# Patient Record
Sex: Female | Born: 1960 | Race: White | Hispanic: No | Marital: Married | Smoking: Never smoker
Health system: Southern US, Community
[De-identification: ages and names within clinical notes are randomized; demographics above are authoritative.]

## PROBLEM LIST (undated history)

## (undated) DIAGNOSIS — Z86718 Personal history of other venous thrombosis and embolism: Secondary | ICD-10-CM

## (undated) DIAGNOSIS — F32A Depression, unspecified: Secondary | ICD-10-CM

## (undated) DIAGNOSIS — I10 Essential (primary) hypertension: Secondary | ICD-10-CM

## (undated) DIAGNOSIS — E669 Obesity, unspecified: Secondary | ICD-10-CM

## (undated) DIAGNOSIS — F329 Major depressive disorder, single episode, unspecified: Secondary | ICD-10-CM

## (undated) HISTORY — DX: Major depressive disorder, single episode, unspecified: F32.9

## (undated) HISTORY — DX: Depression, unspecified: F32.A

## (undated) HISTORY — DX: Personal history of other venous thrombosis and embolism: Z86.718

## (undated) HISTORY — DX: Essential (primary) hypertension: I10

## (undated) HISTORY — DX: Obesity, unspecified: E66.9

---
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
## 1987-06-10 HISTORY — PX: WISDOM TOOTH EXTRACTION: SHX21

## 2004-04-25 ENCOUNTER — Ambulatory Visit: Payer: Self-pay | Admitting: Internal Medicine

## 2004-05-27 ENCOUNTER — Ambulatory Visit: Payer: Self-pay | Admitting: Internal Medicine

## 2005-05-05 ENCOUNTER — Ambulatory Visit: Payer: Self-pay | Admitting: Internal Medicine

## 2006-03-24 ENCOUNTER — Ambulatory Visit: Payer: Self-pay | Admitting: Internal Medicine

## 2007-06-10 HISTORY — PX: ABDOMINAL HYSTERECTOMY: SHX81

## 2007-06-10 HISTORY — PX: TOTAL ABDOMINAL HYSTERECTOMY: SHX209

## 2015-11-15 LAB — BASIC METABOLIC PANEL
BUN: 18 mg/dL (ref 4–21)
Creatinine: 1.5 mg/dL — AB (ref 0.5–1.1)
Glucose: 124 mg/dL

## 2015-11-15 LAB — CBC AND DIFFERENTIAL
HEMATOCRIT: 34 % — AB (ref 36–46)
HEMOGLOBIN: 11.2 g/dL — AB (ref 12.0–16.0)
WBC: 7.2 10*3/mL

## 2015-11-15 LAB — LIPID PANEL
CHOLESTEROL: 170 mg/dL (ref 0–200)
HDL: 55 mg/dL (ref 35–70)
LDL Cholesterol: 98 mg/dL
TRIGLYCERIDES: 84 mg/dL (ref 40–160)

## 2015-11-15 LAB — HEPATIC FUNCTION PANEL
ALT: 17 U/L (ref 7–35)
AST: 15 U/L (ref 13–35)

## 2015-11-16 LAB — HEPATIC FUNCTION PANEL
ALT: 17 U/L (ref 7–35)
AST: 15 U/L (ref 13–35)
Alkaline Phosphatase: 64 U/L (ref 25–125)
Bilirubin, Total: 0.7 mg/dL

## 2015-11-16 LAB — BASIC METABOLIC PANEL
BUN: 18 mg/dL (ref 4–21)
CREATININE: 1.5 mg/dL — AB (ref 0.5–1.1)
GLUCOSE: 124 mg/dL
POTASSIUM: 4.1 mmol/L (ref 3.4–5.3)
Sodium: 141 mmol/L (ref 137–147)

## 2015-12-18 ENCOUNTER — Ambulatory Visit (INDEPENDENT_AMBULATORY_CARE_PROVIDER_SITE_OTHER): Payer: PRIVATE HEALTH INSURANCE | Admitting: Physician Assistant

## 2015-12-18 ENCOUNTER — Encounter: Payer: Self-pay | Admitting: Physician Assistant

## 2015-12-18 VITALS — BP 134/64 | HR 80 | Ht 69.0 in | Wt 274.0 lb

## 2015-12-18 DIAGNOSIS — N183 Chronic kidney disease, stage 3 unspecified: Secondary | ICD-10-CM | POA: Insufficient documentation

## 2015-12-18 DIAGNOSIS — I10 Essential (primary) hypertension: Secondary | ICD-10-CM | POA: Diagnosis not present

## 2015-12-18 DIAGNOSIS — Z86718 Personal history of other venous thrombosis and embolism: Secondary | ICD-10-CM | POA: Diagnosis not present

## 2015-12-18 DIAGNOSIS — F411 Generalized anxiety disorder: Secondary | ICD-10-CM | POA: Insufficient documentation

## 2015-12-18 DIAGNOSIS — Z1211 Encounter for screening for malignant neoplasm of colon: Secondary | ICD-10-CM

## 2015-12-18 DIAGNOSIS — K2100 Gastro-esophageal reflux disease with esophagitis, without bleeding: Secondary | ICD-10-CM

## 2015-12-18 DIAGNOSIS — K222 Esophageal obstruction: Secondary | ICD-10-CM

## 2015-12-18 MED ORDER — LORCASERIN HCL 10 MG PO TABS: 1.0000 | ORAL_TABLET | Freq: Two times a day (BID) | ORAL | Status: DC

## 2015-12-18 MED ORDER — RANITIDINE HCL 150 MG PO TABS: 150.0000 mg | ORAL_TABLET | Freq: Two times a day (BID) | ORAL | Status: DC

## 2015-12-19 ENCOUNTER — Encounter: Payer: Self-pay | Admitting: *Deleted

## 2015-12-19 DIAGNOSIS — Z6839 Body mass index (BMI) 39.0-39.9, adult: Secondary | ICD-10-CM | POA: Insufficient documentation

## 2015-12-19 DIAGNOSIS — Z6838 Body mass index (BMI) 38.0-38.9, adult: Secondary | ICD-10-CM

## 2015-12-19 DIAGNOSIS — E66812 Obesity, class 2: Secondary | ICD-10-CM | POA: Insufficient documentation

## 2015-12-19 NOTE — Progress Notes (Signed)
   Subjective:    Patient ID: Renee Wade, female    DOB: Jan 23, 1961, 55 y.o.   MRN: [id number]  HPI [name] a 55 yo female who presents to the clinic to establish care.  .. Active Ambulatory Problems    Diagnosis Date Noted  . Esophageal stenosis 12/18/2015  . Gastroesophageal reflux disease with esophagitis 12/18/2015  . Essential hypertension, benign 12/18/2015  . History of DVT (deep vein thrombosis) 12/18/2015  . Anxiety state 12/18/2015  . CKD (chronic kidney disease) 12/18/2015   Resolved Ambulatory Problems    Diagnosis Date Noted  . No Resolved Ambulatory Problems   Past Medical History  Diagnosis Date  . Hypertension   . H/O blood clots    .Marland Kitchen Family History  Problem Relation Age of Onset  . Ovarian cancer      grandmother  . Heart attack Father   . Diabetes      grandmother    .Marland Kitchen Social History   Social History  . Marital Status: Married    Spouse Name: N/A  . Number of Children: N/A  . Years of Education: N/A   Occupational History  . Not on file.   Social History Main Topics  . Smoking status: Never Smoker   . Smokeless tobacco: Not on file  . Alcohol Use: No  . Drug Use: No  . Sexual Activity:    Partners: Male   Other Topics Concern  . Not on file   Social History Narrative  . No narrative on file    Does not need refills.   She does feel like GERD and esophageal stenosis at times is not controlled with protonix. Hx of dilation of esophagus. Most of symptoms are at night when she lays down.     Review of Systems  All other systems reviewed and are negative.      Objective:   Physical Exam  Constitutional: She is oriented to person, place, and time. She appears well-developed and well-nourished.  Obesity.   HENT:  Head: Normocephalic and atraumatic.  Cardiovascular: Normal rate, regular rhythm and normal heart sounds.   Pulmonary/Chest: Effort normal and breath sounds normal.  Neurological: She is alert and oriented to  person, place, and time.  Skin: Skin is dry.  Psychiatric: She has a normal mood and affect. Her behavior is normal.          Assessment & Plan:  Morbid obesity- discussed options. BMI 40.  Will start belviq bid. Discussed side effects. Discussed diet and exercise. 150 minutes of exercise a week and 1500 calorie diet. Follow up in 2 months.   GERD/esophageal dilation- continue protonix and zantac as needed at least before dinner.   HTN-controlled benicar.   CKD, stage 3- last Scr 1.48. Discussed stay away from NSAIDs. Tylenol only. Will continue to monitor.   Anxiety- as needed klonapin. Averaging once a day. Does not need refills.   Hx of DVT- daily ASA 325mg .

## 2016-01-08 ENCOUNTER — Encounter: Payer: Self-pay | Admitting: Student

## 2016-01-08 DIAGNOSIS — M67439 Ganglion, unspecified wrist: Secondary | ICD-10-CM | POA: Insufficient documentation

## 2016-01-24 ENCOUNTER — Other Ambulatory Visit: Payer: Self-pay | Admitting: *Deleted

## 2016-01-24 MED ORDER — DICLOFENAC SODIUM 1 % TD GEL: 2.0000 g | TRANSDERMAL | 3 refills | Status: DC | PRN

## 2016-01-24 NOTE — Progress Notes (Unsigned)
Rx sent to pharmacy   

## 2016-01-29 ENCOUNTER — Encounter: Payer: Self-pay | Admitting: Physician Assistant

## 2016-02-07 LAB — HM COLONOSCOPY

## 2016-02-13 ENCOUNTER — Encounter: Payer: Self-pay | Admitting: Physician Assistant

## 2016-02-13 DIAGNOSIS — D369 Benign neoplasm, unspecified site: Secondary | ICD-10-CM | POA: Insufficient documentation

## 2016-05-06 LAB — HM MAMMOGRAPHY

## 2016-05-14 ENCOUNTER — Ambulatory Visit (INDEPENDENT_AMBULATORY_CARE_PROVIDER_SITE_OTHER): Payer: PRIVATE HEALTH INSURANCE | Admitting: Physician Assistant

## 2016-05-14 ENCOUNTER — Encounter: Payer: Self-pay | Admitting: Physician Assistant

## 2016-05-14 VITALS — BP 113/72 | HR 70 | Ht 69.0 in | Wt 276.0 lb

## 2016-05-14 DIAGNOSIS — R059 Cough, unspecified: Secondary | ICD-10-CM

## 2016-05-14 DIAGNOSIS — F411 Generalized anxiety disorder: Secondary | ICD-10-CM | POA: Diagnosis not present

## 2016-05-14 DIAGNOSIS — I10 Essential (primary) hypertension: Secondary | ICD-10-CM | POA: Diagnosis not present

## 2016-05-14 DIAGNOSIS — K2100 Gastro-esophageal reflux disease with esophagitis, without bleeding: Secondary | ICD-10-CM

## 2016-05-14 DIAGNOSIS — R05 Cough: Secondary | ICD-10-CM | POA: Diagnosis not present

## 2016-05-14 LAB — COMPLETE METABOLIC PANEL WITHOUT GFR
ALT: 17 U/L (ref 6–29)
AST: 16 U/L (ref 10–35)
Albumin: 4.1 g/dL (ref 3.6–5.1)
Alkaline Phosphatase: 55 U/L (ref 33–130)
BUN: 17 mg/dL (ref 7–25)
CO2: 26 mmol/L (ref 20–31)
Calcium: 9.4 mg/dL (ref 8.6–10.4)
Chloride: 102 mmol/L (ref 98–110)
Creat: 1.53 mg/dL — ABNORMAL HIGH (ref 0.50–1.05)
GFR, Est African American: 44 mL/min — ABNORMAL LOW
GFR, Est Non African American: 38 mL/min — ABNORMAL LOW
Glucose, Bld: 128 mg/dL — ABNORMAL HIGH (ref 65–99)
Potassium: 4.1 mmol/L (ref 3.5–5.3)
Sodium: 138 mmol/L (ref 135–146)
Total Bilirubin: 0.8 mg/dL (ref 0.2–1.2)
Total Protein: 6.9 g/dL (ref 6.1–8.1)

## 2016-05-14 MED ORDER — DICLOFENAC SODIUM 1 % TD GEL: 2.0000 g | TRANSDERMAL | 3 refills | Status: DC | PRN

## 2016-05-14 MED ORDER — OLMESARTAN MEDOXOMIL-HCTZ 20-12.5 MG PO TABS: 1.0000 | ORAL_TABLET | Freq: Every day | ORAL | 5 refills | Status: DC

## 2016-05-14 MED ORDER — BENZONATATE 200 MG PO CAPS
200.0000 mg | ORAL_CAPSULE | Freq: Two times a day (BID) | ORAL | 0 refills | Status: DC | PRN
Start: 2016-05-14 — End: 2016-11-12

## 2016-05-14 MED ORDER — CLONAZEPAM 0.5 MG PO TABS: ORAL_TABLET | ORAL | 5 refills | Status: DC

## 2016-05-14 MED ORDER — RANITIDINE HCL 150 MG PO TABS: 150.0000 mg | ORAL_TABLET | Freq: Two times a day (BID) | ORAL | 11 refills | Status: DC

## 2016-05-14 MED ORDER — PANTOPRAZOLE SODIUM 40 MG PO TBEC: 40.0000 mg | DELAYED_RELEASE_TABLET | Freq: Every day | ORAL | 11 refills | Status: DC

## 2016-05-14 NOTE — Progress Notes (Addendum)
  Subjective:    Patient ID: Renee Wade, female    DOB: May 27, 1961, 55 y.o.   MRN: [id number]  HPI Patient is a 55 yo female coming to the office today for a six month follow-up for hypertension. Patient is requesting refills for klonopin, voltaren, benicar and protonix.   Patient is also complaining of a cough for five days. She reports she occasionally coughs up yellow phlegm. She also feels short of breath. Patient has taken mucinex with relief. Patient denies nasal congestion, fever, sore throat, fatigue and rhinorrhea. Patient reports that her husband and son has similar symptoms.   Review of Systems  Constitutional: Negative for activity change, fatigue and fever.  HENT: Negative for congestion, rhinorrhea and sore throat.   Respiratory: Positive for cough and shortness of breath.        Objective: Blood pressure 113/72, pulse 70, height 5\' 9"  (1.753 m), weight 276 lb (125.2 kg).   Physical Exam  Constitutional:  Patient is dry coughing during exam.   Cardiovascular: Normal rate and regular rhythm.  Exam reveals no gallop and no friction rub.   No murmur heard. Pulmonary/Chest: Effort normal and breath sounds normal. No respiratory distress. She has no wheezes. She has no rales.       Assessment & Plan:  Dorys was seen today for hypertension and cough.  Diagnoses and all orders for this visit:  1. Cough - Start benzonatate (TESSALON) 200 MG capsule; Take 1 capsule (200 mg total) by mouth 2 (two) times daily as needed for cough. -Told patient to follow-up if symptoms persist.  -If patient's symptoms continue, told patient that she could call ahead of time to get a solumedrol injection 125mg  in office.   2. hypertension, benign -Continue olmesartan-hydrochlorothiazide (BENICAR HCT) 20-12.5 MG tablet; Take 1 tablet by mouth daily for hypertension. Patient blood pressure is well controlled on this medication.  -Check COMPLETE METABOLIC PANEL WITH GFR to monitor  electrolytes, renal and liver function.  -Follow-up in six months   3. Gastroesophageal reflux disease with esophagitis -Continue pantoprazole (PROTONIX) 40 MG tablet; Take 1 tablet (40 mg total) by mouth daily. -Continue ranitidine (ZANTAC) 150 MG tablet; Take 1 tablet (150 mg total) by mouth 2 (two) times daily.  4. Anxiety state -Refilled clonazePAM (KLONOPIN) 0.5 MG tablet; Take one tablet (0.5 mg total) by mouth 2 (two) times a day as needed for Anxiety.  Other orders -Refilled diclofenac sodium (VOLTAREN) 1 % GEL; Apply 2 g topically as needed for joint and muscle pain.

## 2016-06-06 ENCOUNTER — Encounter: Payer: Self-pay | Admitting: Physician Assistant

## 2016-07-14 ENCOUNTER — Other Ambulatory Visit: Payer: Self-pay | Admitting: Physician Assistant

## 2016-07-14 DIAGNOSIS — I10 Essential (primary) hypertension: Secondary | ICD-10-CM

## 2016-11-12 ENCOUNTER — Encounter: Payer: Self-pay | Admitting: Physician Assistant

## 2016-11-12 ENCOUNTER — Ambulatory Visit (INDEPENDENT_AMBULATORY_CARE_PROVIDER_SITE_OTHER): Payer: PRIVATE HEALTH INSURANCE | Admitting: Physician Assistant

## 2016-11-12 VITALS — BP 111/68 | HR 69 | Ht 69.0 in | Wt 268.0 lb

## 2016-11-12 DIAGNOSIS — F411 Generalized anxiety disorder: Secondary | ICD-10-CM | POA: Diagnosis not present

## 2016-11-12 DIAGNOSIS — M7062 Trochanteric bursitis, left hip: Secondary | ICD-10-CM | POA: Diagnosis not present

## 2016-11-12 DIAGNOSIS — Z13220 Encounter for screening for lipoid disorders: Secondary | ICD-10-CM

## 2016-11-12 DIAGNOSIS — N183 Chronic kidney disease, stage 3 unspecified: Secondary | ICD-10-CM

## 2016-11-12 DIAGNOSIS — I10 Essential (primary) hypertension: Secondary | ICD-10-CM

## 2016-11-12 DIAGNOSIS — M7632 Iliotibial band syndrome, left leg: Secondary | ICD-10-CM

## 2016-11-12 DIAGNOSIS — Z6839 Body mass index (BMI) 39.0-39.9, adult: Secondary | ICD-10-CM

## 2016-11-12 LAB — LIPID PANEL W/REFLEX DIRECT LDL
CHOL/HDL RATIO: 3.3 ratio (ref <5.0)
CHOLESTEROL: 169 mg/dL (ref <200)
HDL: 52 mg/dL (ref >50)
LDL-CHOLESTEROL: 97 mg/dL
NON-HDL CHOLESTEROL (CALC): 117 mg/dL (ref <130)
Triglycerides: 107 mg/dL (ref <150)

## 2016-11-12 LAB — COMPLETE METABOLIC PANEL WITH GFR
ALT: 16 U/L (ref 6–29)
AST: 14 U/L (ref 10–35)
Albumin: 4.1 g/dL (ref 3.6–5.1)
Alkaline Phosphatase: 56 U/L (ref 33–130)
BUN: 20 mg/dL (ref 7–25)
CALCIUM: 9.5 mg/dL (ref 8.6–10.4)
CHLORIDE: 103 mmol/L (ref 98–110)
CO2: 25 mmol/L (ref 20–31)
Creat: 1.54 mg/dL — ABNORMAL HIGH (ref 0.50–1.05)
GFR, Est African American: 43 mL/min — ABNORMAL LOW (ref >=60)
GFR, Est Non African American: 38 mL/min — ABNORMAL LOW (ref >=60)
Glucose, Bld: 118 mg/dL — ABNORMAL HIGH (ref 65–99)
POTASSIUM: 4.1 mmol/L (ref 3.5–5.3)
Sodium: 139 mmol/L (ref 135–146)
Total Bilirubin: 1 mg/dL (ref 0.2–1.2)
Total Protein: 6.8 g/dL (ref 6.1–8.1)

## 2016-11-12 MED ORDER — LIRAGLUTIDE -WEIGHT MANAGEMENT 18 MG/3ML ~~LOC~~ SOPN: 0.6000 mg | PEN_INJECTOR | Freq: Every day | SUBCUTANEOUS | 1 refills | Status: DC

## 2016-11-12 MED ORDER — CLONAZEPAM 0.5 MG PO TABS: ORAL_TABLET | ORAL | 5 refills | Status: DC

## 2016-11-12 MED ORDER — OLMESARTAN MEDOXOMIL-HCTZ 20-12.5 MG PO TABS: 1.0000 | ORAL_TABLET | Freq: Every day | ORAL | 5 refills | Status: DC

## 2016-11-12 MED ORDER — DICLOFENAC SODIUM 1 % TD GEL: 2.0000 g | TRANSDERMAL | 3 refills | Status: DC | PRN

## 2016-11-12 NOTE — Patient Instructions (Signed)
Hip Bursitis Hip bursitis is swelling of a fluid-filled sac (bursa) in your hip. This swelling (inflammation) can be painful. This condition may come and go over time. Follow these instructions at home: Medicines  Take over-the-counter and prescription medicines only as told by your doctor.  Do not drive or use heavy machinery while taking prescription pain medicine, or as told by your doctor.  If you were prescribed an antibiotic medicine, take it as told by your doctor. Do not stop taking the antibiotic even if you start to feel better. Activity  Return to your normal activities as told by your doctor. Ask your doctor what activities are safe for you.  Rest and protect your hip until you feel better. General instructions  Wear wraps that put pressure on your hip (compression wraps) only as told by your doctor.  Raise (elevate) your hip above the level of your heart as much as you can. To do this, try putting a pillow under your hips while you lie down. Stop if this causes pain.  Do not use your hip to support your body weight until your doctor says that you can.  Use crutches as told by your doctor.  Gently rub and stretch your injured area as often as is comfortable.  Keep all follow-up visits as told by your doctor. This is important. How is this prevented?  Exercise regularly, as told by your doctor.  Warm up and stretch before being active.  Cool down and stretch after being active.  Avoid activities that bother your hip or cause pain.  Avoid sitting down for long periods at a time. Contact a doctor if:  You have a fever.  You get new symptoms.  You have trouble walking.  You have trouble doing everyday activities.  You have pain that gets worse.  You have pain that does not get better with medicine.  You get red skin on your hip area.  You get a feeling of warmth in your hip area. Get help right away if:  You cannot move your hip.  You have very bad  pain. This information is not intended to replace advice given to you by your health care provider. Make sure you discuss any questions you have with your health care provider. Document Released: 06/28/2010 Document Revised: 11/01/2015 Document Reviewed: 12/26/2014 Elsevier Interactive Patient Education  2018 [place] Ask your health care provider which exercises are safe for you. Do exercises exactly as told by your health care provider and adjust them as directed. It is normal to feel mild stretching, pulling, tightness, or discomfort as you do these exercises, but you should stop right away if you feel sudden pain or your pain gets worse.Do not begin these exercises until told by your health care provider. Stretching and range of motion exercises These exercises warm up your muscles and joints and improve the movement and flexibility of your leg. These exercises also help to relieve pain and stiffness. Exercise A: Quadriceps stretch, prone  1. Lie on your abdomen on a firm surface, such as a bed or padded floor. 2. Bend your left / right knee and hold your ankle. If you cannot reach your ankle or pant leg, loop a belt around your foot and grab the belt instead. 3. Gently pull your heel toward your buttocks. Your knee should not slide out to the side. You should feel a stretch in the front of your thigh and knee. 4. Hold this position for __________ seconds.  Repeat __________ times. Complete this exercise __________ times a day. Exercise B: Lunge ( adductor stretch) 1. Stand and spread your legs about 3 feet (about 1 m) apart. Put your left / right leg slightly back for balance. 2. Lean away from your left / right leg by bending your other knee and shifting your weight toward your bent knee. You may rest your hands on your thigh for balance. You should feel a stretch in your left / right inner thigh. 3. Hold for __________ seconds. Repeat __________ times.  Complete this exercise __________ times a day. Exercise C: Hamstring stretch, supine  1. Lie on your back. 2. Hold both ends of a belt or towel as you loop it over the ball of your left / right foot. The ball of your foot is on the walking surface, right under your toes. 3. Straighten your left / right knee and slowly pull on the belt to raise your leg. Stop when you feel a gentle stretch in the back of your left / right knee or thigh. ? Do not let your left / right knee bend. ? Keep your other leg flat on the floor. 4. Hold this position for __________ seconds. Repeat __________ times. Complete this exercise __________ times a day. Strengthening exercises These exercises build strength and endurance in your leg. Endurance is the ability to use your muscles for a long time, even after they get tired. Exercise D: Quadriceps wall slides  1. Lean your back against a smooth wall or door while you walk your feet out 18-24 inches (46-61 cm) from it. 2. Place your feet hip-width apart. 3. Slowly slide down the wall or door until your knees bend as far as told by your health care provider. Keep your knees over your heels, not your toes. Keep your knees in line with your hips. 4. Hold for __________ seconds. 5. Push through your heels to stand up to rest for __________ seconds after each repetition. Repeat __________ times. Complete this exercise __________ times a day. Exercise E: Straight leg raises ( hip abductors) 1. Lie on your side, with your left / right leg in the top position. Lie so your head, shoulder, knee, and hip line up with each other. You may bend your bottom knee to help you balance. 2. Lift your top leg 4-6 inches (10-15 cm) while keeping your toes pointed straight ahead. 3. Hold this position for __________ seconds. 4. Slowly lower your leg to the starting position. Allow your muscles to relax completely after each repetition. Repeat __________ times. Complete this exercise  __________ times a day. Exercise F: Straight leg raises ( hip extensors) 1. Lie on your abdomen on a firm surface. You can put a pillow under your hips if that is more comfortable. 2. Tense the muscles in your buttocks and lift your left / right leg about 4-6 inches (10-15 cm). Keep your knee straight as you lift your leg. 3. Hold this position for __________ seconds. 4. Slowly lower your leg to the starting position. 5. Let your leg relax completely after each repetition. Repeat __________ times. Complete this exercise __________ times a day. Exercise G: Bridge ( hip extensors) 1. Lie on your back on a firm surface with your knees bent and your feet flat on the floor. 2. Tighten your buttocks muscles and lift your bottom off the floor until your trunk is level with your thighs. ? Do not arch your back. ? You should feel the muscles working in your  buttocks and the back of your thighs. If you do not feel these muscles, slide your feet 1-2 inches (2.5-5 cm) farther away from your buttocks. 3. Hold this position for __________ seconds. 4. Slowly lower your hips to the starting position. 5. Let your buttocks muscles relax completely between repetitions. 6. If this exercise is too easy, try doing it with your arms crossed over your chest. Repeat __________ times. Complete this exercise __________ times a day. This information is not intended to replace advice given to you by your health care provider. Make sure you discuss any questions you have with your health care provider. Document Released: 05/26/2005 Document Revised: 01/31/2016 Document Reviewed: 05/08/2015 Elsevier Interactive Patient Education  [employer].

## 2016-11-12 NOTE — Progress Notes (Signed)
Subjective:    Patient ID: Renee Wade, female    DOB: [birth year], 56 y.o.   MRN: [id number]  HPI [name] a 56 yo female who presents to the clinic for 6 month medication refill.   HTN- doing great. No CP, palpitations, headaches, vision changes.   Anxiety- uses clonazepam at this time as needed. Pt reports to be doing well.   Ongoing left hip and radiation down lateral leg but does not pass the knee pain for last 3 months. Worse after long periods of walking or laying on her left side. Due to CKD no oral NSAIDs but does use voltaren gel.  She would like to lose weight. Tried and failed contrave and belviq. She lost 10lbs earlier this year but gained it all back.   .. Active Ambulatory Problems    Diagnosis Date Noted  . Esophageal stenosis 12/18/2015  . Gastroesophageal reflux disease with esophagitis 12/18/2015  . Essential hypertension, benign 12/18/2015  . History of DVT (deep vein thrombosis) 12/18/2015  . Anxiety state 12/18/2015  . CKD (chronic kidney disease) stage 3, GFR 30-59 ml/min 12/18/2015  . Class 2 severe obesity due to excess calories with serious comorbidity and body mass index (BMI) of 39.0 to 39.9 in adult ([place]) 12/19/2015  . Ganglion cyst of wrist, left 01/08/2016  . Adenomatous polyp 02/13/2016  . It band syndrome, left 11/12/2016  . Trochanteric bursitis of left hip 11/12/2016   Resolved Ambulatory Problems    Diagnosis Date Noted  . No Resolved Ambulatory Problems   Past Medical History:  Diagnosis Date  . H/O DVT   . Hypertension       Review of Systems  All other systems reviewed and are negative.      Objective:   Physical Exam        Assessment & Plan:  Marland [name] KitchenNanie was seen today for hypertension.  Diagnoses and all orders for this visit:  Essential hypertension, benign -     olmesartan-hydrochlorothiazide (BENICAR HCT) 20-12.5 MG tablet; Take 1 tablet by mouth daily.  Anxiety state -     clonazePAM (KLONOPIN) 0.5 MG tablet;  Take one tablet (0.5 mg total) by mouth 2 (two) times a day as needed for Anxiety.  Trochanteric bursitis of left hip -     diclofenac sodium (VOLTAREN) 1 % GEL; Apply 2 g topically as needed.  CKD (chronic kidney disease) stage 3, GFR 30-59 ml/min -     COMPLETE METABOLIC PANEL WITH GFR  Screening for lipid disorders -     Lipid Panel w/reflex Direct LDL  It band syndrome, left -     diclofenac sodium (VOLTAREN) 1 % GEL; Apply 2 g topically as needed.  Class 2 severe obesity due to excess calories with serious comorbidity and body mass index (BMI) of 39.0 to 39.9 in adult (HCC) -     Liraglutide -Weight Management (SAXENDA) 18 MG/3ML SOPN; Inject 0.6 mg into the skin daily. For one week then increase by .6mg  weekly until reaches 3mg  daily.  Please include ultra fine needles 75mm   Aspiration/Injection Procedure Note Renee Wade [id number] June 19, 1960  Procedure: Injection Indications: pain from greater trochanteric bursitis  Procedure Details Consent: Risks of procedure as well as the alternatives and risks of each were explained to the (patient/caregiver).  Consent for procedure obtained. Time Out: Verified patient identification, verified procedure, site/side was marked, verified correct patient position, special equipment/implants available, medications/allergies/relevent history reviewed, required imaging and test results available.  Performed  Local Anesthesia Used:Ethyl Chloride Spray  Cleaned with chlorohexiadine and injected depo medrol 40mg  and lidocaine 1 percent no epi 9cc into left bursa of the greater trochanter.   A sterile dressing was applied.  Patient did tolerate procedure well.  Renee Wade    No NSAIDs due to CKD. Continue to use voltaren gel. HO given with home PT to start. Follow up with sports medicine as needed. Tylenol for pain.   Discussed weight. Discussed 1500 calorie diet and 150 minutes of exercise a week.  Start saxenda. Coupon card  given. Discussed side effects. Discussed tapering up. Follow up in 2 months.

## 2016-11-13 ENCOUNTER — Encounter: Payer: Self-pay | Admitting: Physician Assistant

## 2016-11-13 ENCOUNTER — Ambulatory Visit (INDEPENDENT_AMBULATORY_CARE_PROVIDER_SITE_OTHER): Payer: PRIVATE HEALTH INSURANCE | Admitting: Physician Assistant

## 2016-11-13 VITALS — BP 123/73 | HR 81

## 2016-11-13 DIAGNOSIS — E119 Type 2 diabetes mellitus without complications: Secondary | ICD-10-CM | POA: Diagnosis not present

## 2016-11-13 DIAGNOSIS — E785 Hyperlipidemia, unspecified: Secondary | ICD-10-CM | POA: Diagnosis not present

## 2016-11-13 DIAGNOSIS — N183 Chronic kidney disease, stage 3 unspecified: Secondary | ICD-10-CM

## 2016-11-13 DIAGNOSIS — Z23 Encounter for immunization: Secondary | ICD-10-CM | POA: Diagnosis not present

## 2016-11-13 DIAGNOSIS — E1122 Type 2 diabetes mellitus with diabetic chronic kidney disease: Secondary | ICD-10-CM | POA: Insufficient documentation

## 2016-11-13 LAB — POCT GLYCOSYLATED HEMOGLOBIN (HGB A1C): Hemoglobin A1C: 6.5

## 2016-11-13 MED ORDER — METFORMIN HCL ER 500 MG PO TB24: 1000.0000 mg | ORAL_TABLET | Freq: Every day | ORAL | 1 refills | Status: DC

## 2016-11-13 MED ORDER — ATORVASTATIN CALCIUM 10 MG PO TABS: 10.0000 mg | ORAL_TABLET | Freq: Every day | ORAL | 3 refills | Status: DC

## 2016-11-13 NOTE — Progress Notes (Signed)
HPI:                                                                Renee Wade is a 56 y.o. female who presents to [facility] today for new diagnosis of Type II diabetes  Patient found to have hyperglycemia on fasting CMP. She was brought in for a nurse visit HgbA1c check that showed new diagnosis of Type II DM with A1C of 6.5%. Denies polydipsia, polyuria, vision change, or paresthesias. Endorses polyphagia.   Past Medical History:  Diagnosis Date  . Depression   . H/O DVT    S/p surgery/hysterectomy in 2008  . Hypertension   . Obesity    Past Surgical History:  Procedure Laterality Date  . ABDOMINAL HYSTERECTOMY  2009  . CESAREAN SECTION  1989  . TONSILLECTOMY  1972  . [place] EXTRACTION  1989   Social History  Substance Use Topics  . Smoking status: Never Smoker  . Smokeless tobacco: Never Used  . Alcohol use No   family history includes Heart attack in her father.  ROS: negative except as noted in the HPI  Medications: Current Outpatient Prescriptions  Medication Sig Dispense Refill  . AMBULATORY NON FORMULARY MEDICATION Omega Woman with Evening Primrose Oil    . AMBULATORY NON FORMULARY MEDICATION Take 1,000 mg by mouth daily. Apple cider vinegar, cayenne, ginger, maple    . aspirin 325 MG tablet Take by mouth.    Marland Kitchen atorvastatin (LIPITOR) 10 MG tablet Take 1 tablet (10 mg total) by mouth daily. 30 tablet 3  . clonazePAM (KLONOPIN) 0.5 MG tablet Take one tablet (0.5 mg total) by mouth 2 (two) times a day as needed for Anxiety. 30 tablet 5  . diclofenac sodium (VOLTAREN) 1 % GEL Apply 2 g topically as needed. 100 g 3  . Liraglutide -Weight Management (SAXENDA) 18 MG/3ML SOPN Inject 0.6 mg into the skin daily. For one week then increase by .6mg  weekly until reaches 3mg  daily.  Please include ultra fine needles 10mm 4 pen 1  . metFORMIN (GLUCOPHAGE XR) 500 MG 24 hr tablet Take 2 tablets (1,000 mg total) by mouth  daily with supper. 60 tablet 1  . olmesartan-hydrochlorothiazide (BENICAR HCT) 20-12.5 MG tablet Take 1 tablet by mouth daily. 30 tablet 5  . pantoprazole (PROTONIX) 40 MG tablet Take 1 tablet (40 mg total) by mouth daily. 30 tablet 11  . ranitidine (ZANTAC) 150 MG tablet Take 1 tablet (150 mg total) by mouth 2 (two) times daily. 60 tablet 11  . vitamin B-12 (CYANOCOBALAMIN) 1000 MCG tablet Take 1,000 mcg by mouth daily.     No current facility-administered medications for this visit.    Allergies  Allergen Reactions  . Morphine Nausea And Vomiting  . Oxycodone Nausea And Vomiting  . Belviq [Lorcaserin Hcl] Cough  . Codeine Nausea And Vomiting  . Contrave [Naltrexone-Bupropion Hcl Er]     Irritable.   . Prednisone     Insomnia,mood changes Oral ONLY>       Objective:  BP 123/73   Pulse 81   SpO2 98%  Gen: well-groomed, cooperative, not ill-appearing, no distress Pulm: Normal work of breathing, normal phonation, clear to auscultation bilaterally CV: Normal rate, regular rhythm, s1  and s2 distinct, no murmurs, clicks or rubs  Neuro: alert and oriented x 3, EOM's intact MSK: moving all extremities, normal gait and station, no peripheral edema Psych: good eye contact, normal affect, normal speech and thought content   Results for orders placed or performed in visit on 11/13/16 (from the past 72 hour(s))  POCT HgB A1C     Status: Abnormal   Collection Time: 11/13/16  2:42 PM  Result Value Ref Range   Hemoglobin A1C 6.5    No results found.    Assessment and Plan: 56 y.o. female with   1. Type 2 diabetes mellitus without complication, without long-term current use of insulin (HCC) - POCT HgB A1C 6.5. New diagnosis. Spent 25 minutes, at least 50% face-to-face counseling patient on this diagnosis - patient has stable CKD stage 3, will initiate Metformin 500mg  with evening meal for 2 weeks, then increase to 1000mg  with evening meal. Plan to recheck renal function in 3  weeks - Ambulatory referral to diabetic education - Pneumovax given today - patient is on an ARB - LDL 97, starting statin to achieve goal LDL <70 for primary prevention of heart disease. Plan to recheck in 6 weeks - BP <130/80 - patient is already on full-dose aspirin daily - metFORMIN (GLUCOPHAGE XR) 500 MG 24 hr tablet; Take 2 tablets (1,000 mg total) by mouth daily with supper.  Dispense: 60 tablet; Refill: 1 - atorvastatin (LIPITOR) 10 MG tablet; Take 1 tablet (10 mg total) by mouth daily.  Dispense: 30 tablet; Refill: 3  2. Need for 23-polyvalent pneumococcal polysaccharide vaccine - Pneumococcal polysaccharide vaccine 23-valent greater than or equal to 2yo subcutaneous/IM  Patient education and anticipatory guidance given Patient agrees with treatment plan Follow-up in 3 months or sooner as needed if symptoms worsen or fail to improve  I spent 25 minutes with this patient, greater than 50% was face-to-face time counseling regarding the above diagnoses  Darlyne Russian PA-C

## 2016-11-13 NOTE — Patient Instructions (Addendum)
For Type 2 Diabetes: - Start metformin: 1 tablet with your evening meal for the next 2 weeks, the increase to 2 tablets with your evening meal - Take Atorvastatin 1 tablet daily for cholesterol / primary prevention of heart disease - Continue your daily aspirin - You will receive a phone call to schedule an appointment with the Diabetes Educator - Schedule your annual diabetic eye exam with an Opthalmologist and have results sent to our office - For diet: limit carbohydrates. Look up healthy recipes on the ADA website - Plan to follow-up in 3 months for a re-check A1C   Type 2 Diabetes Mellitus, Diagnosis, Adult Type 2 diabetes (type 2 diabetes mellitus) is a long-term (chronic) disease. In type 2 diabetes, one or both of these problems may be present:  The pancreas does not make enough of a hormone called insulin.  Cells in the body do not respond properly to insulin that the body makes (insulin resistance).  Normally, insulin allows blood sugar (glucose) to enter cells in the body. The cells use glucose for energy. Insulin resistance or lack of insulin causes excess glucose to build up in the blood instead of going into cells. As a result, high blood glucose (hyperglycemia) develops. What increases the risk? The following factors may make you more likely to develop type 2 diabetes:  Having a family member with type 2 diabetes.  Being overweight or obese.  Having an inactive (sedentary) lifestyle.  Having been diagnosed with insulin resistance.  Having a history of prediabetes, gestational diabetes, or polycystic ovarian syndrome (PCOS).  Being of American-Indian, African-American, Hispanic/Latino, or Asian/Pacific Islander descent.  What are the signs or symptoms? In the early stage of this condition, you may not have symptoms. Symptoms develop slowly and may include:  Increased thirst (polydipsia).  Increased hunger(polyphagia).  Increased urination  (polyuria).  Increased urination during the night (nocturia).  Unexplained weight loss.  Frequent infections that keep coming back (recurring).  Fatigue.  Weakness.  Vision changes, such as blurry vision.  Cuts or bruises that are slow to heal.  Tingling or numbness in the hands or feet.  Dark patches on the skin (acanthosis nigricans).  How is this diagnosed?  This condition is diagnosed based on your symptoms, your medical history, a physical exam, and your blood glucose level. Your blood glucose may be checked with one or more of the following blood tests:  A fasting blood glucose (FBG) test. You will not be allowed to eat (you will fast) for at least 8 hours before a blood sample is taken.  A random blood glucose test. This checks blood glucose at any time of day regardless of when you ate.  An A1c (hemoglobin A1c) blood test. This provides information about blood glucose control over the previous 2-3 months.  An oral glucose tolerance test (OGTT). This measures your blood glucose at two times: ? After fasting. This is your baseline blood glucose level. ? Two hours after drinking a beverage that contains glucose.  You may be diagnosed with type 2 diabetes if:  Your FBG level is 126 mg/dL (7.0 mmol/L) or higher.  Your random blood glucose level is 200 mg/dL (11.1 mmol/L) or higher.  Your A1c level is 6.5% or higher.  Your OGGT result is higher than 200 mg/dL (11.1 mmol/L).  These blood tests may be repeated to confirm your diagnosis. How is this treated?  Your treatment may be managed by a specialist called an endocrinologist. Type 2 diabetes may be treated by  following instructions from your health care provider about:  Making diet and lifestyle changes. This may include: ? Following an individualized nutrition plan that is developed by a diet and nutrition specialist (registered dietitian). ? Exercising regularly. ? Finding ways to manage stress.  Checking  your blood glucose level as often as directed.  Taking diabetes medicines or insulin daily. This helps to keep your blood glucose levels in the healthy range. ? If you use insulin, you may need to adjust the dosage depending on how physically active you are and what foods you eat. Your health care provider will tell you how to adjust your dosage.  Taking medicines to help prevent complications from diabetes, such as: ? Aspirin. ? Medicine to lower cholesterol. ? Medicine to control blood pressure.  Your health care provider will set individualized treatment goals for you. Your goals will be based on your age, other medical conditions you have, and how you respond to diabetes treatment. Generally, the goal of treatment is to maintain the following blood glucose levels:  Before meals (preprandial): 80-130 mg/dL (4.4-7.2 mmol/L).  After meals (postprandial): below 180 mg/dL (10 mmol/L).  A1c level: less than 7%.  Follow these instructions at home: Questions to [facility] Provider Consider asking the following questions:  Do I need to meet with a diabetes educator?  Where can I find a support group for people with diabetes?  What equipment will I need to manage my diabetes at home?  What diabetes medicines do I need, and when should I take them?  How often do I need to check my blood glucose?  What number can I call if I have questions?  When is my next appointment?  General instructions  Take over-the-counter and prescription medicines only as told by your health care provider.  Keep all follow-up visits as told by your health care provider. This is important.  For more information about diabetes, visit: ? American Diabetes Association (ADA): www.diabetes.org ? American Association of Diabetes Educators (AADE): www.diabeteseducator.org/patient-resources Contact a health care provider if:  Your blood glucose is at or above 240 mg/dL (13.3 mmol/L) for 2 days in a  row.  You have been sick or have had a fever for 2 days or longer and you are not getting better.  You have any of the following problems for more than 6 hours: ? You cannot eat or drink. ? You have nausea and vomiting. ? You have diarrhea. Get help right away if:  Your blood glucose is lower than 54 mg/dL (3.0 mmol/L).  You become confused or you have trouble thinking clearly.  You have difficulty breathing.  You have moderate or large ketone levels in your urine. This information is not intended to replace advice given to you by your health care provider. Make sure you discuss any questions you have with your health care provider. Document Released: 05/26/2005 Document Revised: 11/01/2015 Document Reviewed: 06/29/2015 Elsevier Interactive Patient Education  2017 [employer].

## 2016-11-14 ENCOUNTER — Telehealth: Payer: Self-pay | Admitting: *Deleted

## 2016-11-14 ENCOUNTER — Encounter: Payer: Self-pay | Admitting: Physician Assistant

## 2016-11-14 DIAGNOSIS — E785 Hyperlipidemia, unspecified: Secondary | ICD-10-CM | POA: Insufficient documentation

## 2016-11-14 NOTE — Telephone Encounter (Signed)
-----   Message from [place], [place] sent at 11/14/2016 11:39 AM EDT ----- Can you let [name] know I would like her to have her kidney function checked in 3 weeks to make sure she is tolerating the Metformin

## 2016-11-14 NOTE — Telephone Encounter (Signed)
[id number]  Pre Authorization sent to cover my meds.

## 2016-11-14 NOTE — Telephone Encounter (Signed)
Pt notified -EH/RMA  

## 2016-12-16 ENCOUNTER — Ambulatory Visit: Payer: PRIVATE HEALTH INSURANCE

## 2016-12-23 ENCOUNTER — Ambulatory Visit: Payer: PRIVATE HEALTH INSURANCE

## 2016-12-30 ENCOUNTER — Ambulatory Visit: Payer: PRIVATE HEALTH INSURANCE

## 2017-01-27 ENCOUNTER — Ambulatory Visit: Payer: PRIVATE HEALTH INSURANCE | Admitting: Physician Assistant

## 2017-01-30 ENCOUNTER — Encounter: Payer: Self-pay | Admitting: Physician Assistant

## 2017-01-30 ENCOUNTER — Ambulatory Visit (INDEPENDENT_AMBULATORY_CARE_PROVIDER_SITE_OTHER): Payer: PRIVATE HEALTH INSURANCE | Admitting: Physician Assistant

## 2017-01-30 VITALS — BP 107/68 | HR 69 | Resp 18 | Wt 252.7 lb

## 2017-01-30 DIAGNOSIS — N183 Chronic kidney disease, stage 3 unspecified: Secondary | ICD-10-CM

## 2017-01-30 DIAGNOSIS — Z6839 Body mass index (BMI) 39.0-39.9, adult: Secondary | ICD-10-CM

## 2017-01-30 DIAGNOSIS — E119 Type 2 diabetes mellitus without complications: Secondary | ICD-10-CM

## 2017-01-30 DIAGNOSIS — Z79899 Other long term (current) drug therapy: Secondary | ICD-10-CM | POA: Diagnosis not present

## 2017-01-30 MED ORDER — LIRAGLUTIDE 18 MG/3ML ~~LOC~~ SOPN: PEN_INJECTOR | SUBCUTANEOUS | 2 refills | Status: DC

## 2017-01-30 NOTE — Progress Notes (Signed)
Subjective:    Patient ID: Renee Wade, female    DOB: 09-26-1960, 56 y.o.   MRN: [id number]  HPI  [name] a 56 yo female that was recently dx with type II diabetes while I was out of maternity leave. She comes in to discuss medications. She states she was immediately started on medications and not given a chance of diet and exercise. She has already started with cutting out sweet tea and sodas. She has decreased her portion size in half. She has lost 25lbs since January. She has had a lot going on with father having triple bypass and husband prostate cancer. She is not currently exercising. She is checking her fasting sugars and ranging from 90-120's. She did not start any medications given.   . Active Ambulatory Problems    Diagnosis Date Noted  . Esophageal stenosis 12/18/2015  . Gastroesophageal reflux disease with esophagitis 12/18/2015  . Essential hypertension, benign 12/18/2015  . History of DVT (deep vein thrombosis) 12/18/2015  . Anxiety state 12/18/2015  . CKD (chronic kidney disease) stage 3, GFR 30-59 ml/min 12/18/2015  . Class 2 severe obesity due to excess calories with serious comorbidity and body mass index (BMI) of 39.0 to 39.9 in adult ([facility]) 12/19/2015  . Ganglion cyst of wrist, left 01/08/2016  . Adenomatous polyp 02/13/2016  . It band syndrome, left 11/12/2016  . Trochanteric bursitis of left hip 11/12/2016  . Type 2 diabetes mellitus without complication, without long-term current use of insulin ([facility]) 11/13/2016  . Dyslipidemia, goal LDL below 70 11/14/2016   Resolved Ambulatory Problems    Diagnosis Date Noted  . No Resolved Ambulatory Problems   Past Medical History:  Diagnosis Date  . Depression   . H/O DVT   . Hypertension   . Obesity        Review of Systems  All other systems reviewed and are negative.      Objective:   Physical Exam  Constitutional: She is oriented to person, place, and time. She appears well-developed and well-nourished.   Obese.   HENT:  Head: Normocephalic and atraumatic.  Cardiovascular: Normal rate, regular rhythm and normal heart sounds.   Pulmonary/Chest: Effort normal and breath sounds normal.  Neurological: She is alert and oriented to person, place, and time.  Psychiatric: She has a normal mood and affect. Her behavior is normal.          Assessment & Plan:  Marland [name] KitchenBlanchie was seen today for advice only.  Diagnoses and all orders for this visit:  Type 2 diabetes mellitus without complication, without long-term current use of insulin (HCC)  CKD (chronic kidney disease) stage 3, GFR 30-59 ml/min -     Basic metabolic panel  Medication management -     Basic metabolic panel  Other orders -     liraglutide (VICTOZA) 18 MG/3ML SOPN; Started 0.6 mg injected subcutaneously daily for one week, then increase to 1.2 mg daily for one week then increase to 1.8mg  injected subcutaneously daily.  Please include needles.   Discussed diabetes and weight. We had previously tried to get saxenda approved for weight and could not. I would like to now start her on victoza. Discussed tirtration up.  Discussed low carb diet with 1500 calories and 80g of protein.  Exercising at least 150 minutes a week.  My Fitness Pal could be a Microbiologist.  I would also start metformin. Will check bmp in 2 weeks to make sure kidneys not effected.  encouraged annual  eye exam.  Pt declined flu shot.  Pneumonia shot up to date.  Follow up in 2 months.   Goal weight is 200 lbs.

## 2017-01-30 NOTE — Patient Instructions (Addendum)
Start metformin and victoza. Recheck in 3 months.

## 2017-02-01 ENCOUNTER — Encounter: Payer: Self-pay | Admitting: Physician Assistant

## 2017-02-01 DIAGNOSIS — Z6839 Body mass index (BMI) 39.0-39.9, adult: Secondary | ICD-10-CM | POA: Insufficient documentation

## 2017-04-16 ENCOUNTER — Other Ambulatory Visit: Payer: Self-pay | Admitting: Physician Assistant

## 2017-04-16 DIAGNOSIS — I10 Essential (primary) hypertension: Secondary | ICD-10-CM

## 2017-04-16 DIAGNOSIS — F411 Generalized anxiety disorder: Secondary | ICD-10-CM

## 2017-04-20 ENCOUNTER — Encounter: Payer: Self-pay | Admitting: Physician Assistant

## 2017-04-20 ENCOUNTER — Ambulatory Visit (INDEPENDENT_AMBULATORY_CARE_PROVIDER_SITE_OTHER): Payer: PRIVATE HEALTH INSURANCE | Admitting: Physician Assistant

## 2017-04-20 VITALS — BP 114/70 | HR 102 | Ht 69.0 in | Wt 251.0 lb

## 2017-04-20 DIAGNOSIS — Z6839 Body mass index (BMI) 39.0-39.9, adult: Secondary | ICD-10-CM

## 2017-04-20 DIAGNOSIS — Z13220 Encounter for screening for lipoid disorders: Secondary | ICD-10-CM

## 2017-04-20 DIAGNOSIS — E119 Type 2 diabetes mellitus without complications: Secondary | ICD-10-CM

## 2017-04-20 DIAGNOSIS — I10 Essential (primary) hypertension: Secondary | ICD-10-CM | POA: Diagnosis not present

## 2017-04-20 DIAGNOSIS — K2100 Gastro-esophageal reflux disease with esophagitis, without bleeding: Secondary | ICD-10-CM

## 2017-04-20 LAB — COMPLETE METABOLIC PANEL WITH GFR
AG Ratio: 1.4 (calc) (ref 1.0–2.5)
ALKALINE PHOSPHATASE (APISO): 66 U/L (ref 33–130)
ALT: 13 U/L (ref 6–29)
AST: 14 U/L (ref 10–35)
Albumin: 4.4 g/dL (ref 3.6–5.1)
BUN / CREAT RATIO: 11 (calc) (ref 6–22)
BUN: 19 mg/dL (ref 7–25)
CALCIUM: 10 mg/dL (ref 8.6–10.4)
CO2: 31 mmol/L (ref 20–32)
CREATININE: 1.76 mg/dL — AB (ref 0.50–1.05)
Chloride: 102 mmol/L (ref 98–110)
GFR, EST AFRICAN AMERICAN: 37 mL/min/{1.73_m2} — AB (ref >=60)
GFR, Est Non African American: 32 mL/min/{1.73_m2} — ABNORMAL LOW (ref >=60)
GLOBULIN: 3.1 g/dL (ref 1.9–3.7)
GLUCOSE: 112 mg/dL — AB (ref 65–99)
Potassium: 4.2 mmol/L (ref 3.5–5.3)
Sodium: 139 mmol/L (ref 135–146)
Total Bilirubin: 1.1 mg/dL (ref 0.2–1.2)
Total Protein: 7.5 g/dL (ref 6.1–8.1)

## 2017-04-20 LAB — LIPID PANEL W/REFLEX DIRECT LDL
CHOL/HDL RATIO: 3.3 (calc) (ref <5.0)
CHOLESTEROL: 208 mg/dL — AB (ref <200)
HDL: 63 mg/dL (ref >50)
LDL Cholesterol (Calc): 126 mg/dL (calc) — ABNORMAL HIGH
Non-HDL Cholesterol (Calc): 145 mg/dL (calc) — ABNORMAL HIGH (ref <130)
Triglycerides: 89 mg/dL (ref <150)

## 2017-04-20 LAB — POCT GLYCOSYLATED HEMOGLOBIN (HGB A1C): HEMOGLOBIN A1C: 6

## 2017-04-20 MED ORDER — SUCRALFATE 1 G PO TABS: 1.0000 g | ORAL_TABLET | Freq: Four times a day (QID) | ORAL | 0 refills | Status: DC

## 2017-04-20 NOTE — Progress Notes (Signed)
Subjective:    Patient ID: Renee Wade, female    DOB: February 01, 1961, 56 y.o.   MRN: [id number]  HPI [name] a 56 yo female who presents to the clinic for DM follow up. She was started on victoza for DM and to help with weight loss. She does feel like her appetite has been curbed some with victoza. She started for one month and then stopped for one month. She then forgot and went back to 1.8mg  without titration up. She has been very nauseated for 3 days and her acid reflux is terrible despite taking protonix and zantac. She is not taking metformin.   .. Active Ambulatory Problems    Diagnosis Date Noted  . Esophageal stenosis 12/18/2015  . Gastroesophageal reflux disease with esophagitis 12/18/2015  . Essential hypertension, benign 12/18/2015  . History of DVT (deep vein thrombosis) 12/18/2015  . Anxiety state 12/18/2015  . CKD (chronic kidney disease) stage 3, GFR 30-59 ml/min (HCC) 12/18/2015  . Class 2 severe obesity due to excess calories with serious comorbidity and body mass index (BMI) of 39.0 to 39.9 in adult ([facility]) 12/19/2015  . Ganglion cyst of wrist, left 01/08/2016  . Adenomatous polyp 02/13/2016  . It band syndrome, left 11/12/2016  . Trochanteric bursitis of left hip 11/12/2016  . Type 2 diabetes mellitus without complication, without long-term current use of insulin ([facility]) 11/13/2016  . Dyslipidemia, goal LDL below 70 11/14/2016  . BMI 39.0-39.9,adult 02/01/2017   Resolved Ambulatory Problems    Diagnosis Date Noted  . No Resolved Ambulatory Problems   Past Medical History:  Diagnosis Date  . Depression   . H/O DVT   . Hypertension   . Obesity      Review of Systems  All other systems reviewed and are negative.      Objective:   Physical Exam  Constitutional: She is oriented to person, place, and time. She appears well-developed and well-nourished.  HENT:  Head: Normocephalic and atraumatic.  Right Ear: External ear normal.  Left Ear: External ear  normal.  Nose: Nose normal.  Mouth/Throat: Oropharynx is clear and moist. No oropharyngeal exudate.  No sinus tenderness.   Neck: Normal range of motion. Neck supple.  Cardiovascular: Normal rate, regular rhythm and normal heart sounds.  Pulmonary/Chest: Effort normal and breath sounds normal. She has no wheezes.  Lymphadenopathy:    She has no cervical adenopathy.  Neurological: She is alert and oriented to person, place, and time.  Psychiatric: She has a normal mood and affect. Her behavior is normal.          Assessment & Plan:  Marland [name] KitchenQuentin was seen today for diabetes and obesity.  Diagnoses and all orders for this visit:  Type 2 diabetes mellitus without complication, without long-term current use of insulin (HCC) -     POCT HgB A1C -     COMPLETE METABOLIC PANEL WITH GFR  Gastroesophageal reflux disease with esophagitis -     sucralfate (CARAFATE) 1 g tablet; Take 1 tablet (1 g total) 4 (four) times daily by mouth.  Essential hypertension, benign  Screening for lipid disorders -     Lipid Panel w/reflex Direct LDL  Class 2 severe obesity due to excess calories with serious comorbidity and body mass index (BMI) of 39.0 to 39.9 in adult [facility])   .Marland Kitchen Results for orders placed or performed in visit on 04/20/17  POCT HgB A1C  Result Value Ref Range   Hemoglobin A1C 6.0    A!C  has improved from 6.5.  She wants to give vicotza another go. She will start back at .6mg  daily and titrate slowy to 1.8mg .  On ARB. BP controlled.  Lipid panel ordered today.  Needs eye exam.  Weight stable hoping to see a loss this month.  Follow up in 3 months.   For acid reflux added carafate. No signs of upper respiratory infection. Discussed side effect of vicotza but can improve. Give one more month.   .Discussed low carb diet with 1500 calories and 80g of protein.  Exercising at least 150 minutes a week.  My Fitness Pal could be a Microbiologist.

## 2017-04-21 ENCOUNTER — Encounter: Payer: Self-pay | Admitting: Physician Assistant

## 2017-04-21 LAB — HM DIABETES EYE EXAM

## 2017-04-21 NOTE — Progress Notes (Signed)
Call pt LDL goal is under 70 with DM. We should start a statin. Is this ok?  Kidney function worsened some. NO NSAIDs and make sure staying hydrated. Recheck in 2 weeks.

## 2017-04-22 ENCOUNTER — Encounter: Payer: Self-pay | Admitting: Physician Assistant

## 2017-05-08 LAB — HM MAMMOGRAPHY

## 2017-05-22 ENCOUNTER — Encounter: Payer: Self-pay | Admitting: Physician Assistant

## 2017-05-25 ENCOUNTER — Other Ambulatory Visit: Payer: Self-pay | Admitting: Physician Assistant

## 2017-05-25 ENCOUNTER — Encounter: Payer: Self-pay | Admitting: Physician Assistant

## 2017-05-25 ENCOUNTER — Other Ambulatory Visit: Payer: Self-pay | Admitting: *Deleted

## 2017-05-25 DIAGNOSIS — K2100 Gastro-esophageal reflux disease with esophagitis, without bleeding: Secondary | ICD-10-CM

## 2017-05-25 MED ORDER — PANTOPRAZOLE SODIUM 40 MG PO TBEC: 40.0000 mg | DELAYED_RELEASE_TABLET | Freq: Every day | ORAL | 11 refills | Status: DC

## 2017-05-25 MED ORDER — RANITIDINE HCL 150 MG PO TABS: 150.0000 mg | ORAL_TABLET | Freq: Two times a day (BID) | ORAL | 11 refills | Status: DC

## 2017-06-19 ENCOUNTER — Encounter: Payer: Self-pay | Admitting: Physician Assistant

## 2017-06-24 ENCOUNTER — Other Ambulatory Visit: Payer: Self-pay | Admitting: Physician Assistant

## 2017-06-24 ENCOUNTER — Encounter: Payer: Self-pay | Admitting: Physician Assistant

## 2017-06-24 DIAGNOSIS — F411 Generalized anxiety disorder: Secondary | ICD-10-CM

## 2017-06-24 MED ORDER — CLONAZEPAM 0.5 MG PO TABS: ORAL_TABLET | ORAL | 1 refills | Status: DC

## 2017-07-24 ENCOUNTER — Other Ambulatory Visit: Payer: Self-pay | Admitting: Physician Assistant

## 2017-07-24 DIAGNOSIS — F411 Generalized anxiety disorder: Secondary | ICD-10-CM

## 2017-08-10 ENCOUNTER — Ambulatory Visit: Payer: PRIVATE HEALTH INSURANCE | Admitting: Physician Assistant

## 2017-09-25 ENCOUNTER — Other Ambulatory Visit: Payer: Self-pay | Admitting: Physician Assistant

## 2017-09-25 DIAGNOSIS — M7632 Iliotibial band syndrome, left leg: Secondary | ICD-10-CM

## 2017-09-25 DIAGNOSIS — M7062 Trochanteric bursitis, left hip: Secondary | ICD-10-CM

## 2017-09-29 ENCOUNTER — Emergency Department (INDEPENDENT_AMBULATORY_CARE_PROVIDER_SITE_OTHER)
Admission: EM | Admit: 2017-09-29 | Discharge: 2017-09-29 | Disposition: A | Discharge status: Home / Self Care | Payer: PRIVATE HEALTH INSURANCE | Source: Home / Self Care

## 2017-09-29 ENCOUNTER — Emergency Department (INDEPENDENT_AMBULATORY_CARE_PROVIDER_SITE_OTHER): Payer: PRIVATE HEALTH INSURANCE

## 2017-09-29 ENCOUNTER — Other Ambulatory Visit: Payer: Self-pay

## 2017-09-29 DIAGNOSIS — M79675 Pain in left toe(s): Secondary | ICD-10-CM

## 2017-09-29 DIAGNOSIS — S90229A Contusion of unspecified lesser toe(s) with damage to nail, initial encounter: Secondary | ICD-10-CM

## 2017-09-29 DIAGNOSIS — L237 Allergic contact dermatitis due to plants, except food: Secondary | ICD-10-CM | POA: Diagnosis not present

## 2017-09-29 MED ORDER — TRIAMCINOLONE ACETONIDE 40 MG/ML IJ SUSP
80.0000 mg | Freq: Once | INTRAMUSCULAR | Status: AC
  Administered 2017-09-29: 80 mg via INTRAMUSCULAR

## 2017-09-29 NOTE — ED Triage Notes (Signed)
Pt has rash on face, chest, wrist, and leg.  Exposed to poison ivy on Friday.  Also, hit left middle toe on grocery cart on Friday.

## 2017-09-29 NOTE — ED Provider Notes (Signed)
[facility]    CSN: [id number] Arrival date & time: 09/29/17  0803     History   Chief Complaint Chief Complaint  Patient presents with  . Rash  . Toe Pain    left middle    HPI Alannie Amodio is a 57 y.o. female.   The history is provided by the patient. No language interpreter was used.  Rash  Location:  Face Facial rash location:  Face Quality: itchiness   Severity:  Moderate Onset quality:  Gradual Progression:  Worsening Chronicity:  New Context: plant contact   Relieved by:  None tried Worsened by:  Nothing Ineffective treatments:  Antihistamines Associated symptoms: nausea   Toe Pain  This is a new problem. The current episode started yesterday. The problem occurs constantly. The problem has not changed since onset.Nothing aggravates the symptoms. Nothing relieves the symptoms. She has tried nothing for the symptoms.  Pt hit her toe,  Pt complains of swelling and bruising.   Past Medical History:  Diagnosis Date  . Depression   . H/O DVT    S/p surgery/hysterectomy in 2008  . Hypertension   . Obesity     Patient Active Problem List   Diagnosis Date Noted  . BMI 39.0-39.9,adult 02/01/2017  . Dyslipidemia, goal LDL below 70 11/14/2016  . Type 2 diabetes mellitus without complication, without long-term current use of insulin ([facility]) 11/13/2016  . It band syndrome, left 11/12/2016  . Trochanteric bursitis of left hip 11/12/2016  . Adenomatous polyp 02/13/2016  . Ganglion cyst of wrist, left 01/08/2016  . Class 2 severe obesity due to excess calories with serious comorbidity and body mass index (BMI) of 39.0 to 39.9 in adult ([facility]) 12/19/2015  . Esophageal stenosis 12/18/2015  . Gastroesophageal reflux disease with esophagitis 12/18/2015  . Essential hypertension, benign 12/18/2015  . History of DVT (deep vein thrombosis) 12/18/2015  . Anxiety state 12/18/2015  . CKD (chronic kidney disease) stage 3, GFR 30-59 ml/min (HCC) 12/18/2015     Past Surgical History:  Procedure Laterality Date  . ABDOMINAL HYSTERECTOMY  2009  . CESAREAN SECTION  1989  . TONSILLECTOMY  1972  . WISDOM TOOTH EXTRACTION  1989    OB History   None      Home Medications    Prior to Admission medications   Medication Sig Start Date End Date Taking? Authorizing Provider  AMBULATORY NON FORMULARY MEDICATION Omega Woman with Evening Primrose Oil    [provider]  AMBULATORY NON FORMULARY MEDICATION Take 1,000 mg by mouth daily. Apple cider vinegar, cayenne, ginger, maple    [provider]  aspirin 325 MG tablet Take by mouth.    [provider]  clonazePAM (KLONOPIN) 0.5 MG tablet TAKE 1 TABLET BY MOUTH TWICE DAILY AS NEEDED FOR ANXIETY 07/27/17   Breeback, [name] L, PA-C  diclofenac sodium (VOLTAREN) 1 % GEL Apply 2 g topically as needed. 11/12/16   Breeback, Royetta Car, PA-C  olmesartan-hydrochlorothiazide (BENICAR HCT) 20-12.5 MG tablet Take 1 tablet by mouth daily. 04/16/17   Breeback, [name] L, PA-C  pantoprazole (PROTONIX) 40 MG tablet Take 1 tablet (40 mg total) by mouth daily. 05/25/17   Breeback, Royetta Car, PA-C  ranitidine (ZANTAC) 150 MG tablet Take 1 tablet (150 mg total) by mouth 2 (two) times daily. 05/25/17   Donella Stade, PA-C  sucralfate (CARAFATE) 1 g tablet Take 1 tablet (1 g total) 4 (four) times daily by mouth. 04/20/17   Breeback, Royetta Car, PA-C  vitamin B-12 (CYANOCOBALAMIN) 1000 MCG tablet Take 1,000 mcg by mouth daily.    [provider]    Family History Family History  Problem Relation Age of Onset  . Ovarian cancer Unknown        grandmother  . Heart attack Father   . Diabetes Unknown        grandmother     Social History Social History   Tobacco Use  . Smoking status: Never Smoker  . Smokeless tobacco: Never Used  Substance Use Topics  . Alcohol use: No    Alcohol/week: 0.0 oz  . Drug use: No     Allergies   Morphine; Oxycodone; Belviq [lorcaserin hcl]; Codeine;  Contrave [naltrexone-bupropion hcl er]; and Prednisone   Review of Systems Review of Systems  Gastrointestinal: Positive for nausea.  Skin: Positive for rash.     Physical Exam Triage Vital Signs ED Triage Vitals  Enc Vitals Group     BP 09/29/17 0826 116/73     Pulse Rate 09/29/17 0826 64     Resp --      Temp 09/29/17 0826 97.7 F (36.5 C)     Temp Source 09/29/17 0826 Oral     SpO2 09/29/17 0826 100 %     Weight 09/29/17 0827 262 lb (118.8 kg)     Height 09/29/17 0827 5\' 9"  (1.753 m)     Head Circumference --      Peak Flow --      Pain Score 09/29/17 0826 5     Pain Loc --      Pain Edu? --      Excl. in [place]? --    No data found.  Updated Vital Signs BP 116/73 (BP Location: Right Arm)   Pulse 64   Temp 97.7 F (36.5 C) (Oral)   Ht 5\' 9"  (1.753 m)   Wt 262 lb (118.8 kg)   SpO2 100%   BMI 38.69 kg/m   Visual Acuity Right Eye Distance:   Left Eye Distance:   Bilateral Distance:    Right Eye Near:   Left Eye Near:    Bilateral Near:     Physical Exam  Constitutional: She appears well-developed and well-nourished.  HENT:  Head: Normocephalic.  Right Ear: External ear normal.  Left Ear: External ear normal.  Eyes: Pupils are equal, round, and reactive to light.  Neck: Normal range of motion.  Cardiovascular: Normal rate.  Pulmonary/Chest: Effort normal.  Musculoskeletal: Normal range of motion.  Neurological: She is alert.  Skin: Rash noted.  Rash face, and chin, raised erythematous,   Nursing note and vitals reviewed.    UC Treatments / Results  Labs (all labs ordered are listed, but only abnormal results are displayed) Labs Reviewed - No data to display  EKG None Radiology Dg Foot Complete Left  Result Date: 09/29/2017 CLINICAL DATA:  Left great toe pain after injury last week. EXAM: LEFT FOOT - COMPLETE 3+ VIEW COMPARISON:  None. FINDINGS: There is no evidence of fracture or dislocation. There is no evidence of arthropathy or other focal  bone abnormality. Soft tissues are unremarkable. IMPRESSION: No significant abnormality seen in the left foot. Electronically Signed   By: Marijo Conception, M.D.   On: 09/29/2017 08:59    Procedures Procedures (including critical care time)  Medications Ordered in UC Medications  triamcinolone acetonide (KENALOG-40) injection 80 mg (80 mg Intramuscular Given 09/29/17 0841)     Initial Impression / Assessment and Plan / UC Course  I have reviewed the triage vital signs and the nursing notes.  Pertinent labs & imaging results that were available during my care of the patient were reviewed by me and considered in my medical decision making (see chart for details).     MDM  Pt given injection to help with poison ivy.  Pt can not tolerate oral steroids.  Xray reviewed no fx.   Final Clinical Impressions(s) / UC Diagnoses   Final diagnoses:  Poison ivy  Contusion of toe with damage to nail, unspecified toe, initial encounter    ED Discharge Orders    None       Controlled Substance Prescriptions [place] Controlled Substance Registry consulted? Not Applicable  An After Visit Summary was printed and given to the patient.   Fransico [place], [place] 09/29/17 [phone]

## 2017-09-29 NOTE — Discharge Instructions (Addendum)
Return if any problems.

## 2017-10-06 ENCOUNTER — Ambulatory Visit (INDEPENDENT_AMBULATORY_CARE_PROVIDER_SITE_OTHER): Payer: PRIVATE HEALTH INSURANCE | Admitting: Physician Assistant

## 2017-10-06 ENCOUNTER — Encounter: Payer: Self-pay | Admitting: Physician Assistant

## 2017-10-06 VITALS — BP 115/40 | HR 57 | Ht 69.0 in | Wt 258.0 lb

## 2017-10-06 DIAGNOSIS — L237 Allergic contact dermatitis due to plants, except food: Secondary | ICD-10-CM | POA: Diagnosis not present

## 2017-10-06 DIAGNOSIS — I10 Essential (primary) hypertension: Secondary | ICD-10-CM | POA: Diagnosis not present

## 2017-10-06 MED ORDER — OLMESARTAN MEDOXOMIL-HCTZ 20-12.5 MG PO TABS: 1.0000 | ORAL_TABLET | Freq: Every day | ORAL | 5 refills | Status: DC

## 2017-10-06 MED ORDER — METHYLPREDNISOLONE SODIUM SUCC 125 MG IJ SOLR
125.0000 mg | Freq: Once | INTRAMUSCULAR | Status: AC
  Administered 2017-10-06: 125 mg via INTRAMUSCULAR

## 2017-10-06 MED ORDER — HYDROXYZINE HCL 50 MG PO TABS: 50.0000 mg | ORAL_TABLET | Freq: Every evening | ORAL | 1 refills | Status: DC | PRN

## 2017-10-06 MED ORDER — METHYLPREDNISOLONE ACETATE 40 MG/ML IJ SUSP
40.0000 mg | Freq: Once | INTRAMUSCULAR | Status: AC
  Administered 2017-10-06: 40 mg via INTRAMUSCULAR

## 2017-10-06 NOTE — Patient Instructions (Signed)
Poison Ivy Dermatitis Poison ivy dermatitis is redness and soreness (inflammation) of the skin. It is caused by a chemical that is found on the leaves of the poison ivy plant. You may also have itching, a rash, and blisters. Symptoms often clear up in 1-2 weeks. You may get this condition by touching a poison ivy plant. You can also get it by touching something that has the chemical on it. This may include animals or objects that have come in contact with the plant. Follow these instructions at home: General instructions  Take or apply over-the-counter and prescription medicines only as told by your doctor.  If you touch poison ivy, wash your skin with soap and cold water right away.  Use hydrocortisone creams or calamine lotion as needed to help with itching.  Take oatmeal baths as needed. Use colloidal oatmeal. You can get this at a pharmacy or grocery store. Follow the instructions on the package.  Do not scratch or rub your skin.  While you have the rash, wash your clothes right after you wear them. Prevention  Know what poison ivy looks like so you can avoid it. This plant has three leaves with flowering branches on a single stem. The leaves are glossy. They have uneven edges that come to a point at the front.  If you have touched poison ivy, wash with soap and water right away. Be sure to wash under your fingernails.  When hiking or camping, wear long pants, a long-sleeved shirt, tall socks, and hiking boots. You can also use a lotion on your skin that helps to prevent contact with the chemical on the plant.  If you think that your clothes or outdoor gear came in contact with poison ivy, rinse them off with a garden hose before you bring them inside your house. Contact a doctor if:  You have open sores in the rash area.  You have more redness, swelling, or pain in the affected area.  You have redness that spreads beyond the rash area.  You have fluid, blood, or pus coming from  the affected area.  You have a fever.  You have a rash over a large area of your body.  You have a rash on your eyes, mouth, or genitals.  Your rash does not get better after a few days. Get help right away if:  Your face swells or your eyes swell shut.  You have trouble breathing.  You have trouble swallowing. This information is not intended to replace advice given to you by your health care provider. Make sure you discuss any questions you have with your health care provider. Document Released: 06/28/2010 Document Revised: 11/01/2015 Document Reviewed: 11/01/2014 Elsevier Interactive Patient Education  2018 [employer] Inc[employer]  

## 2017-10-06 NOTE — Progress Notes (Signed)
Subjective:    Patient ID: Renee Wade, female    DOB: 1961/02/22, 57 y.o.   MRN: [id number]  HPI  Patient is a 57 year old female who presents to the clinic with persistent poison ivy dermatitis.  On good Friday almost a week and a half ago a tree fell in her yard with poison ivy all over it.  She cut it up for firewood and since has had poison ivy all over her trunk, bilateral arms, bilateral extremities.  She is sensitive to poison ivy.  She went to urgent care and got a Solu-Medrol 125 mg shot.  She does feel significantly better but she still has persistent itching in a mild rash is still present.  She is not able to tolerate oral prednisone.  Hypertension-patient is doing well on her blood pressure medication.  She denies any chest pains, palpitations, headaches or vision changes.  She needs refill today.  .. Active Ambulatory Problems    Diagnosis Date Noted  . Esophageal stenosis 12/18/2015  . Gastroesophageal reflux disease with esophagitis 12/18/2015  . Essential hypertension, benign 12/18/2015  . History of DVT (deep vein thrombosis) 12/18/2015  . Anxiety state 12/18/2015  . CKD (chronic kidney disease) stage 3, GFR 30-59 ml/min (HCC) 12/18/2015  . Class 2 severe obesity due to excess calories with serious comorbidity and body mass index (BMI) of 39.0 to 39.9 in adult ([place]) 12/19/2015  . Ganglion cyst of wrist, left 01/08/2016  . Adenomatous polyp 02/13/2016  . It band syndrome, left 11/12/2016  . Trochanteric bursitis of left hip 11/12/2016  . Type 2 diabetes mellitus without complication, without long-term current use of insulin ([place]) 11/13/2016  . Dyslipidemia, goal LDL below 70 11/14/2016  . BMI 39.0-39.9,adult 02/01/2017   Resolved Ambulatory Problems    Diagnosis Date Noted  . No Resolved Ambulatory Problems   Past Medical History:  Diagnosis Date  . Depression   . H/O DVT   . Hypertension   . Obesity      Review of Systems  All other systems reviewed  and are negative.      Objective:   Physical Exam  Constitutional: She is oriented to person, place, and time. She appears well-developed and well-nourished.  HENT:  Head: Normocephalic and atraumatic.  Cardiovascular: Normal rate and regular rhythm.  Pulmonary/Chest: Effort normal and breath sounds normal.  Neurological: She is alert and oriented to person, place, and time.  Skin:  Scattered vesicles over trunk, under breast, bilateral arms and bilateral legs.           Assessment & Plan:  Marland [name] KitchenDiagnoses and all orders for this visit:  Poison ivy dermatitis -     hydrOXYzine (ATARAX/VISTARIL) 50 MG tablet; Take 1 tablet (50 mg total) by mouth at bedtime and may repeat dose one time if needed. For itching. -     methylPREDNISolone sodium succinate (SOLU-MEDROL) 125 mg/2 mL injection 125 mg -     methylPREDNISolone acetate (DEPO-MEDROL) injection 40 mg  Essential hypertension, benign -     olmesartan-hydrochlorothiazide (BENICAR HCT) 20-12.5 MG tablet; Take 1 tablet by mouth daily.   Patient cannot tolerate oral prednisone therefore we will give another shot of Solu-Medrol 125 mg and Depo-Medrol 40 mg today.  Hopefully this will improve itching and completely resolve rash.  I did give her some hydroxyzine in addition to itching.  Sedation warning given.  She can mostly use this as bedtime so that she can relax more.  Follow-up if not improving.  Refills given.  BP controlled.

## 2017-11-24 ENCOUNTER — Encounter: Payer: Self-pay | Admitting: Physician Assistant

## 2017-11-24 ENCOUNTER — Ambulatory Visit (INDEPENDENT_AMBULATORY_CARE_PROVIDER_SITE_OTHER): Payer: PRIVATE HEALTH INSURANCE | Admitting: Physician Assistant

## 2017-11-24 VITALS — BP 107/65 | HR 84 | Temp 98.0°F | Ht 69.0 in | Wt 259.0 lb

## 2017-11-24 DIAGNOSIS — Z6838 Body mass index (BMI) 38.0-38.9, adult: Secondary | ICD-10-CM

## 2017-11-24 DIAGNOSIS — N183 Chronic kidney disease, stage 3 unspecified: Secondary | ICD-10-CM

## 2017-11-24 DIAGNOSIS — F411 Generalized anxiety disorder: Secondary | ICD-10-CM

## 2017-11-24 DIAGNOSIS — E1122 Type 2 diabetes mellitus with diabetic chronic kidney disease: Secondary | ICD-10-CM

## 2017-11-24 MED ORDER — CLONAZEPAM 0.5 MG PO TABS: 0.5000 mg | ORAL_TABLET | Freq: Two times a day (BID) | ORAL | 5 refills | Status: DC | PRN

## 2017-11-24 NOTE — Progress Notes (Signed)
Subjective:    Patient ID: Renee Wade, female    DOB: November 15, 1960, 57 y.o.   MRN: [id number]  HPI  Patient is a 57 year old obese female with type 2 diabetes, hypertension, CKD 3, GERD who presents to the clinic for follow-up and medication refill.  She denies checking her sugars.  She is only on Victoza.  She is unable to tolerate metformin.  She has not been able to titrate up to the 1.8 mg dose.  She continues on the 1.2 mg dose daily.  She denies any hypoglycemic events.  She denies any open sores or wounds.  She continues to work on weight loss.  She admits she is not exercising.  She has lost 7 pounds in the last 3 months.  She is working first on her diet and then she hopes to become more active.  She does seek a refill on her Klonopin for anxiety.  Overall she feels like this is appropriate for her anxiety.  She has tried daily medications in the past and not responded well.  She denies any suicidal homicidal lobulations.   .. Active Ambulatory Problems    Diagnosis Date Noted  . Esophageal stenosis 12/18/2015  . Gastroesophageal reflux disease with esophagitis 12/18/2015  . Essential hypertension, benign 12/18/2015  . History of DVT (deep vein thrombosis) 12/18/2015  . Anxiety state 12/18/2015  . CKD (chronic kidney disease) stage 3, GFR 30-59 ml/min (HCC) 12/18/2015  . Class 2 severe obesity due to excess calories with serious comorbidity and body mass index (BMI) of 38.0 to 38.9 in adult ([place]) 12/19/2015  . Ganglion cyst of wrist, left 01/08/2016  . Adenomatous polyp 02/13/2016  . It band syndrome, left 11/12/2016  . Trochanteric bursitis of left hip 11/12/2016  . Type 2 diabetes mellitus without complication, without long-term current use of insulin ([place]) 11/13/2016  . Dyslipidemia, goal LDL below 70 11/14/2016   Resolved Ambulatory Problems    Diagnosis Date Noted  . BMI 39.0-39.9,adult 02/01/2017   Past Medical History:  Diagnosis Date  . Depression   . H/O  DVT   . Hypertension   . Obesity      Review of Systems  All other systems reviewed and are negative.      Objective:   Physical Exam  Constitutional: She is oriented to person, place, and time. She appears well-developed and well-nourished.  HENT:  Head: Normocephalic and atraumatic.  Cardiovascular: Normal rate and regular rhythm.  Pulmonary/Chest: Effort normal and breath sounds normal.  Neurological: She is alert and oriented to person, place, and time.  Psychiatric: She has a normal mood and affect. Her behavior is normal.          Assessment & Plan:  Marland [name] KitchenDanniella was seen today for medication refill.  Diagnoses and all orders for this visit:  Type 2 diabetes mellitus with stage 3 chronic kidney disease, without long-term current use of insulin (HCC) -     Cancel: Hemoglobin A1c -     Cancel: POCT A1C -     POCT A1C  Anxiety state -     clonazePAM (KLONOPIN) 0.5 MG tablet; Take 1 tablet (0.5 mg total) by mouth 2 (two) times daily as needed. for anxiety -     COMPLETE METABOLIC PANEL WITH GFR  Class 2 severe obesity due to excess calories with serious comorbidity and body mass index (BMI) of 38.0 to 38.9 in adult [facility])  CKD (chronic kidney disease) stage 3, GFR 30-59 ml/min (HCC)   .Marland Kitchen  Lab Results  Component Value Date   HGBA1C 6.2 (A) 11/25/2017   A1c controlled today.  Patient is doing well on Victoza.  Encouraged her to increase to 1.8 mg over time if she starts to be able to tolerate.  She does not need refills. Blood pressure controlled and on our up. Patient is not on a statin at this time.  LDL goal is less than 70.   Klonopin refilled for 5 months.  Overall she is controlled.  Continue to discuss potential dependence.  She does not want to start anything daily for anxiety.  Continue to work on weight loss.  Discussed increasing protein decreasing carbs and sugars.  Encouraged at least 150 minutes of exercise weekly.  Follow-up in 3 months.

## 2017-11-25 ENCOUNTER — Encounter: Payer: Self-pay | Admitting: Physician Assistant

## 2017-11-25 LAB — POCT GLYCOSYLATED HEMOGLOBIN (HGB A1C): Hemoglobin A1C: 6.2 % — AB (ref 4.0–5.6)

## 2018-03-16 ENCOUNTER — Ambulatory Visit (INDEPENDENT_AMBULATORY_CARE_PROVIDER_SITE_OTHER): Payer: PRIVATE HEALTH INSURANCE | Admitting: Physician Assistant

## 2018-03-16 ENCOUNTER — Encounter: Payer: Self-pay | Admitting: Physician Assistant

## 2018-03-16 VITALS — BP 122/55 | HR 53 | Ht 69.0 in | Wt 261.0 lb

## 2018-03-16 DIAGNOSIS — I10 Essential (primary) hypertension: Secondary | ICD-10-CM | POA: Diagnosis not present

## 2018-03-16 DIAGNOSIS — N183 Chronic kidney disease, stage 3 unspecified: Secondary | ICD-10-CM

## 2018-03-16 DIAGNOSIS — E1122 Type 2 diabetes mellitus with diabetic chronic kidney disease: Secondary | ICD-10-CM

## 2018-03-16 DIAGNOSIS — E785 Hyperlipidemia, unspecified: Secondary | ICD-10-CM

## 2018-03-16 DIAGNOSIS — R600 Localized edema: Secondary | ICD-10-CM | POA: Diagnosis not present

## 2018-03-16 DIAGNOSIS — Z1159 Encounter for screening for other viral diseases: Secondary | ICD-10-CM

## 2018-03-16 LAB — POCT GLYCOSYLATED HEMOGLOBIN (HGB A1C): Hemoglobin A1C: 6.2 % — AB (ref 4.0–5.6)

## 2018-03-16 MED ORDER — FUROSEMIDE 20 MG PO TABS: ORAL_TABLET | ORAL | 1 refills | Status: DC

## 2018-03-16 MED ORDER — OLMESARTAN MEDOXOMIL-HCTZ 20-12.5 MG PO TABS: 1.0000 | ORAL_TABLET | Freq: Every day | ORAL | 5 refills | Status: DC

## 2018-03-16 NOTE — Patient Instructions (Signed)

## 2018-03-17 DIAGNOSIS — R600 Localized edema: Secondary | ICD-10-CM | POA: Insufficient documentation

## 2018-03-17 LAB — COMPLETE METABOLIC PANEL WITH GFR
AG RATIO: 1.8 (calc) (ref 1.0–2.5)
ALBUMIN MSPROF: 3.9 g/dL (ref 3.6–5.1)
ALT: 19 U/L (ref 6–29)
AST: 17 U/L (ref 10–35)
Alkaline phosphatase (APISO): 50 U/L (ref 33–130)
BILIRUBIN TOTAL: 0.7 mg/dL (ref 0.2–1.2)
BUN / CREAT RATIO: 10 (calc) (ref 6–22)
BUN: 16 mg/dL (ref 7–25)
CO2: 31 mmol/L (ref 20–32)
Calcium: 9.5 mg/dL (ref 8.6–10.4)
Chloride: 106 mmol/L (ref 98–110)
Creat: 1.61 mg/dL — ABNORMAL HIGH (ref 0.50–1.05)
GFR, Est African American: 41 mL/min/{1.73_m2} — ABNORMAL LOW (ref >=60)
GFR, Est Non African American: 35 mL/min/{1.73_m2} — ABNORMAL LOW (ref >=60)
GLOBULIN: 2.2 g/dL (ref 1.9–3.7)
Glucose, Bld: 102 mg/dL — ABNORMAL HIGH (ref 65–99)
POTASSIUM: 3.9 mmol/L (ref 3.5–5.3)
SODIUM: 142 mmol/L (ref 135–146)
Total Protein: 6.1 g/dL (ref 6.1–8.1)

## 2018-03-17 LAB — HEPATITIS C ANTIBODY
HEP C AB: NONREACTIVE
SIGNAL TO CUT-OFF: 0.01 (ref <1.00)

## 2018-03-17 LAB — LIPID PANEL W/REFLEX DIRECT LDL
CHOLESTEROL: 176 mg/dL (ref <200)
HDL: 53 mg/dL (ref >50)
LDL CHOLESTEROL (CALC): 106 mg/dL — AB
Non-HDL Cholesterol (Calc): 123 mg/dL (calc) (ref <130)
TRIGLYCERIDES: 82 mg/dL (ref <150)
Total CHOL/HDL Ratio: 3.3 (calc) (ref <5.0)

## 2018-03-17 LAB — TSH: TSH: 3.11 m[IU]/L (ref 0.40–4.50)

## 2018-03-17 NOTE — Progress Notes (Signed)
Subjective:    Patient ID: Renee Wade, female    DOB: 10-08-1960, 57 y.o.   MRN: [id number]  HPI Patient is a 57 year old obese female with type 2 diabetes, CKD 3, dyslipidemia, hypertension who presents to the clinic for follow-up and to discuss recent onset of lower extremity swelling.  She has never really have a lot of bilateral feet swelling.  She admits she was at a wedding this weekend and was on her feet a lot more than she is used to.  Swelling seems to be going down some.  At times they were aching but now they do not seem to bother her.  She does have a history of blood clots.  This does not feel like a blood clot but she wanted to get it checked out.  She denies any ankle sprain or trauma in the area.  She denies any shortness of breath or orthopnea.  She has not had an A1c done in a while.  We will order this today.  She has not been checking her sugars.  She does take Victoza daily.  She denies any hypoglycemic events.  Hypertension-no chest pain, palpitations, vision changes.  She is taking her Benicar/HCTZ.  There was an issue with the pharmacy.  They said they did not have this medication.  Then they gave HER-2 different manufacturers.  She does not feel like it was the same medication.  She feels like he could have been different.  She is going to call back with what the bottle say.  .. Active Ambulatory Problems    Diagnosis Date Noted  . Esophageal stenosis 12/18/2015  . Gastroesophageal reflux disease with esophagitis 12/18/2015  . Essential hypertension, benign 12/18/2015  . History of DVT (deep vein thrombosis) 12/18/2015  . Anxiety state 12/18/2015  . CKD (chronic kidney disease) stage 3, GFR 30-59 ml/min (HCC) 12/18/2015  . Class 2 severe obesity due to excess calories with serious comorbidity and body mass index (BMI) of 38.0 to 38.9 in adult ([place]) 12/19/2015  . Ganglion cyst of wrist, left 01/08/2016  . Adenomatous polyp 02/13/2016  . It band syndrome, left  11/12/2016  . Trochanteric bursitis of left hip 11/12/2016  . Type II diabetes mellitus with stage 3 chronic kidney disease ([place]) 11/13/2016  . Dyslipidemia, goal LDL below 70 11/14/2016  . Lower extremity edema 03/17/2018   Resolved Ambulatory Problems    Diagnosis Date Noted  . BMI 39.0-39.9,adult 02/01/2017   Past Medical History:  Diagnosis Date  . Depression   . H/O DVT   . Hypertension   . Obesity       Review of Systems  All other systems reviewed and are negative.      Objective:   Physical Exam  Constitutional: She is oriented to person, place, and time. She appears well-developed and well-nourished.  Obese.   Cardiovascular: Normal rate and regular rhythm.  Pulmonary/Chest: Effort normal and breath sounds normal.  Neurological: She is alert and oriented to person, place, and time.  Skin:  Bilateral feet and ankle swelling scant right worse left. No tenderness, bruising, erythema, warmth.   Psychiatric: She has a normal mood and affect. Her behavior is normal.          Assessment & Plan:  Marland [name] KitchenDiagnoses and all orders for this visit:  Lower extremity edema -     COMPLETE METABOLIC PANEL WITH GFR -     furosemide (LASIX) 20 MG tablet; Take one tablet as needed for lower extremity swelling. -  TSH  Type 2 diabetes mellitus with stage 3 chronic kidney disease, without long-term current use of insulin (HCC) -     COMPLETE METABOLIC PANEL WITH GFR -     POCT glycosylated hemoglobin (Hb A1C)  Dyslipidemia, goal LDL below 70 -     Lipid Panel w/reflex Direct LDL  Essential hypertension, benign -     olmesartan-hydrochlorothiazide (BENICAR HCT) 20-12.5 MG tablet; Take 1 tablet by mouth daily.  Need for hepatitis C screening test -     Hepatitis C Antibody   .Marland Kitchen Lab Results  Component Value Date   HGBA1C 6.2 (A) 03/16/2018   Needs labs.  Continue victoza.  BP controlled.  Not on statin. Discussed need for statin. Will get lipid and better explain  cardiovascular risk.    Edema seems to be improving. I do not see any evidence of volume overload. Likely venous stasis. Get compression stockings. Lasix given as needed usage. I am not concerned about blood clots at this time. Discussed worsening symptoms to let us know.   Follow up in 3 months.

## 2018-03-17 NOTE — Progress Notes (Signed)
Call pt: LDL is not to goal of under 70 being a diabetic. Would you consider a low dose statin?  Kidney function slightly improved still in CKD 3.

## 2018-03-24 ENCOUNTER — Other Ambulatory Visit: Payer: Self-pay | Admitting: Physician Assistant

## 2018-03-24 DIAGNOSIS — I10 Essential (primary) hypertension: Secondary | ICD-10-CM

## 2018-04-01 ENCOUNTER — Telehealth: Payer: Self-pay

## 2018-04-01 DIAGNOSIS — Z6838 Body mass index (BMI) 38.0-38.9, adult: Secondary | ICD-10-CM

## 2018-04-01 DIAGNOSIS — E1122 Type 2 diabetes mellitus with diabetic chronic kidney disease: Secondary | ICD-10-CM

## 2018-04-01 DIAGNOSIS — N183 Chronic kidney disease, stage 3 (moderate): Principal | ICD-10-CM

## 2018-04-01 MED ORDER — LIRAGLUTIDE 18 MG/3ML ~~LOC~~ SOPN: 1.8000 mg | PEN_INJECTOR | Freq: Every day | SUBCUTANEOUS | 2 refills | Status: DC

## 2018-04-01 NOTE — Telephone Encounter (Signed)
Chrissie Noa from [facility] called requsting RF on Victoza for pt.   Medication on med list states this came from historical provider. OV notes state: "Patient is doing well on Victoza.  Encouraged her to increase to 1.8 mg over time if she starts to be able to tolerate. "  RX pended, please send if ok to RF

## 2018-04-05 ENCOUNTER — Encounter: Payer: Self-pay | Admitting: Physician Assistant

## 2018-04-05 DIAGNOSIS — Z5320 Procedure and treatment not carried out because of patient's decision for unspecified reasons: Secondary | ICD-10-CM | POA: Insufficient documentation

## 2018-04-22 ENCOUNTER — Telehealth: Payer: Self-pay

## 2018-04-22 NOTE — Telephone Encounter (Signed)
Ok to refill for 1 year 

## 2018-04-22 NOTE — Telephone Encounter (Signed)
I received a refill request today for Renee Wade. It says she is wanting to have a refill on the pantoprazole. I see that it has not been refilled since 05/25/2017. Just wanted to double check with you first about having it refilled. Thanks!

## 2018-04-24 ENCOUNTER — Encounter: Payer: Self-pay | Admitting: Physician Assistant

## 2018-04-24 DIAGNOSIS — K2100 Gastro-esophageal reflux disease with esophagitis, without bleeding: Secondary | ICD-10-CM

## 2018-04-26 MED ORDER — PANTOPRAZOLE SODIUM 40 MG PO TBEC: 40.0000 mg | DELAYED_RELEASE_TABLET | Freq: Every day | ORAL | 11 refills | Status: DC

## 2018-05-15 ENCOUNTER — Encounter: Payer: Self-pay | Admitting: Physician Assistant

## 2018-05-17 ENCOUNTER — Telehealth: Payer: Self-pay

## 2018-05-17 DIAGNOSIS — R600 Localized edema: Secondary | ICD-10-CM

## 2018-05-17 MED ORDER — FUROSEMIDE 20 MG PO TABS: ORAL_TABLET | ORAL | 1 refills | Status: DC

## 2018-05-17 NOTE — Telephone Encounter (Signed)
Lasix refilled.

## 2018-05-26 ENCOUNTER — Ambulatory Visit (INDEPENDENT_AMBULATORY_CARE_PROVIDER_SITE_OTHER): Payer: PRIVATE HEALTH INSURANCE | Admitting: Physician Assistant

## 2018-05-26 ENCOUNTER — Encounter: Payer: Self-pay | Admitting: Physician Assistant

## 2018-05-26 VITALS — BP 117/53 | HR 70 | Ht 69.0 in | Wt 262.0 lb

## 2018-05-26 DIAGNOSIS — N183 Chronic kidney disease, stage 3 unspecified: Secondary | ICD-10-CM

## 2018-05-26 DIAGNOSIS — I10 Essential (primary) hypertension: Secondary | ICD-10-CM

## 2018-05-26 DIAGNOSIS — R7303 Prediabetes: Secondary | ICD-10-CM

## 2018-05-26 DIAGNOSIS — J0100 Acute maxillary sinusitis, unspecified: Secondary | ICD-10-CM

## 2018-05-26 DIAGNOSIS — M10072 Idiopathic gout, left ankle and foot: Secondary | ICD-10-CM

## 2018-05-26 LAB — POCT GLYCOSYLATED HEMOGLOBIN (HGB A1C): Hemoglobin A1C: 6.3 % — AB (ref 4.0–5.6)

## 2018-05-26 MED ORDER — HYDROCOD POLST-CPM POLST ER 10-8 MG/5ML PO SUER: 5.0000 mL | Freq: Two times a day (BID) | ORAL | 0 refills | Status: DC | PRN

## 2018-05-26 MED ORDER — AZITHROMYCIN 250 MG PO TABS: ORAL_TABLET | ORAL | 0 refills | Status: DC

## 2018-05-26 MED ORDER — BENZONATATE 200 MG PO CAPS: 200.0000 mg | ORAL_CAPSULE | Freq: Two times a day (BID) | ORAL | 0 refills | Status: DC | PRN

## 2018-05-26 MED ORDER — METHYLPREDNISOLONE SODIUM SUCC 125 MG IJ SOLR
125.0000 mg | Freq: Once | INTRAMUSCULAR | Status: AC
  Administered 2018-05-26: 125 mg via INTRAMUSCULAR

## 2018-05-26 NOTE — Patient Instructions (Signed)

## 2018-05-26 NOTE — Progress Notes (Signed)
Subjective:    Patient ID: Renee Wade, female    DOB: [birth year], 57 y.o.   MRN: [id number]  HPI  [name] a 57 yo female with T2DM, HTN who presents to the clinic for 3 month follow up.   DM- not taking any diabetic medication. Controlling with diet. No hypoglycemia. Not checking sugars.   She is having 1 week of sinus pressure, ear popping, cough, sinus draining. She has been taking OTC tylenol cold sinus severe. No fever, chills, body aches. She leaves for [place] on Friday and wants to feel better. Request medication for cough since she will be on plane.   She recently had an episode of left great toe pain and suspected gout. She saw ortho and was given an steroid injection in toe. Help immediately. No known triggers. Not taking anything currently.   .. Active Ambulatory Problems    Diagnosis Date Noted  . Esophageal stenosis 12/18/2015  . Gastroesophageal reflux disease with esophagitis 12/18/2015  . Essential hypertension, benign 12/18/2015  . History of DVT (deep vein thrombosis) 12/18/2015  . Anxiety state 12/18/2015  . CKD (chronic kidney disease) stage 3, GFR 30-59 ml/min (HCC) 12/18/2015  . Class 2 severe obesity due to excess calories with serious comorbidity and body mass index (BMI) of 38.0 to 38.9 in adult ([place]) 12/19/2015  . Ganglion cyst of wrist, left 01/08/2016  . Adenomatous polyp 02/13/2016  . It band syndrome, left 11/12/2016  . Trochanteric bursitis of left hip 11/12/2016  . Type II diabetes mellitus with stage 3 chronic kidney disease ([place]) 11/13/2016  . Dyslipidemia, goal LDL below 70 11/14/2016  . Lower extremity edema 03/17/2018  . Statin declined 04/05/2018   Resolved Ambulatory Problems    Diagnosis Date Noted  . BMI 39.0-39.9,adult 02/01/2017   Past Medical History:  Diagnosis Date  . Depression   . H/O DVT   . Hypertension   . Obesity         Review of Systems  All other systems reviewed and are negative.      Objective:   Physical Exam Constitutional:      Appearance: Normal appearance.  HENT:     Head: Normocephalic and atraumatic.     Right Ear: Tympanic membrane, ear canal and external ear normal.     Left Ear: Tympanic membrane, ear canal and external ear normal.     Ears:     Comments: Tenderness over maxillary sinuses to palpation.     Nose: Congestion present.     Mouth/Throat:     Mouth: Mucous membranes are moist.     Pharynx: Posterior oropharyngeal erythema present.  Eyes:     Conjunctiva/sclera: Conjunctivae normal.  Neck:     Musculoskeletal: Normal range of motion.  Cardiovascular:     Rate and Rhythm: Normal rate and regular rhythm.  Pulmonary:     Effort: Pulmonary effort is normal.     Breath sounds: Normal breath sounds.  Musculoskeletal:     Comments: Great left toe without redness, swelling or tenderness.   Lymphadenopathy:     Cervical: No cervical adenopathy.  Neurological:     General: No focal deficit present.     Mental Status: She is alert.  Psychiatric:        Mood and Affect: Mood normal.           Assessment & Plan:  Marland [name] KitchenBrooklyne was seen today for follow-up.  Diagnoses and all orders for this visit:  Pre-diabetes -  POCT glycosylated hemoglobin (Hb A1C)  Essential hypertension, benign  Acute non-recurrent maxillary sinusitis -     azithromycin (ZITHROMAX) 250 MG tablet; Take 2 tablets now and then one tablet for 4 days. -     benzonatate (TESSALON) 200 MG capsule; Take 1 capsule (200 mg total) by mouth 2 (two) times daily as needed for cough. -     chlorpheniramine-HYDROcodone (Startex) 10-8 MG/5ML SUER; Take 5 mLs by mouth every 12 (twelve) hours as needed for cough (cough, will cause drowsiness.). -     methylPREDNISolone sodium succinate (SOLU-MEDROL) 125 mg/2 mL injection 125 mg  CKD (chronic kidney disease) stage 3, GFR 30-59 ml/min (HCC)     .Marland Kitchen Results for orders placed or performed in visit on 05/26/18  POCT glycosylated hemoglobin (Hb  A1C)  Result Value Ref Range   Hemoglobin A1C 6.3 (A) 4.0 - 5.6 %   HbA1c POC (<> result, manual entry)     HbA1c, POC (prediabetic range)     HbA1c, POC (controlled diabetic range)      A!C is good.  Since not on medication. Resolved DM and made pre DM.  Discussed good diet control.  Pt declined flu shot.  Needs eye exam.  BP looks good.   Treated for sinus infection with solumedrol, zpak, tessalon, hycodan. Pt would like to feel better for vacation. Discussed other symptomatic care.   Sounds like a gout flare. Could have been diet related. If she has another flare follow up with out sports med for drainage. When we do labs again will check uric  Acid.

## 2018-05-27 ENCOUNTER — Encounter: Payer: Self-pay | Admitting: *Deleted

## 2018-05-27 ENCOUNTER — Emergency Department (INDEPENDENT_AMBULATORY_CARE_PROVIDER_SITE_OTHER)
Admission: EM | Admit: 2018-05-27 | Discharge: 2018-05-27 | Disposition: A | Discharge status: Home / Self Care | Payer: PRIVATE HEALTH INSURANCE | Source: Home / Self Care | Attending: Family Medicine | Admitting: Family Medicine

## 2018-05-27 DIAGNOSIS — Z23 Encounter for immunization: Secondary | ICD-10-CM | POA: Diagnosis not present

## 2018-05-27 DIAGNOSIS — S0101XA Laceration without foreign body of scalp, initial encounter: Secondary | ICD-10-CM | POA: Diagnosis not present

## 2018-05-27 MED ORDER — TETANUS-DIPHTH-ACELL PERTUSSIS 5-2.5-18.5 LF-MCG/0.5 IM SUSP
0.5000 mL | Freq: Once | INTRAMUSCULAR | Status: AC
  Administered 2018-05-27: 0.5 mL via INTRAMUSCULAR

## 2018-05-27 NOTE — Discharge Instructions (Signed)
°  Do not get the area wet for 24 hours, then use caution when washing or combing your hair so you do not cut open the wound again.  If you get dirty or go swimming in a pool or ocean later next week, be sure to thoroughly rinse your scalp.    Follow up as needed.

## 2018-05-27 NOTE — ED Provider Notes (Signed)
[facility]    CSN: [id number] Arrival date & time: 05/27/18  1031     History   Chief Complaint Chief Complaint  Patient presents with  . Laceration    HPI Renee Wade is a 57 y.o. female.   HPI Renee Wade is a 57 y.o. female presenting to UC with c/o laceration to the top of her head. Pt states she had sunglasses on her head when a television fell onto the top of her head, pushing her sunglasses down.  Denies LOC. Pain is aching and stinging some. She is on daily ASA 325mg , which she took this morning. Bleeding controlled PTA. Last tetanus in 2013.  She leaves tomorrow for a cruise to [place] and plans to go snorkeling the end of next week.  She is currently on a prednisone dose pack and azithromycin, which was started yesterday by her PCP for a URI.     Past Medical History:  Diagnosis Date  . Depression   . H/O DVT    S/p surgery/hysterectomy in 2008  . Hypertension   . Obesity     Patient Active Problem List   Diagnosis Date Noted  . Statin declined 04/05/2018  . Lower extremity edema 03/17/2018  . Dyslipidemia, goal LDL below 70 11/14/2016  . 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 It band syndrome, left 11/12/2016  . Trochanteric bursitis of left hip 11/12/2016  . Adenomatous polyp 02/13/2016  . Ganglion cyst of wrist, left 01/08/2016  . Class 2 severe obesity due to excess calories with serious comorbidity and body mass index (BMI) of 38.0 to 38.9 in adult ([place]) 12/19/2015  . Esophageal stenosis 12/18/2015  . Gastroesophageal reflux disease with esophagitis 12/18/2015  . Essential hypertension, benign 12/18/2015  . History of DVT (deep vein thrombosis) 12/18/2015  . Anxiety state 12/18/2015  . CKD (chronic kidney disease) stage 3, GFR 30-59 ml/min (HCC) 12/18/2015    Past Surgical History:  Procedure Laterality Date  . ABDOMINAL HYSTERECTOMY  2009  . CESAREAN SECTION  1989  . TONSILLECTOMY   1972  . WISDOM TOOTH EXTRACTION  1989    OB History   No obstetric history on file.      Home Medications    Prior to Admission medications   Medication Sig Start Date End Date Taking? Authorizing Provider  AMBULATORY NON FORMULARY MEDICATION Omega Woman with Evening Primrose Oil    [provider]  AMBULATORY NON FORMULARY MEDICATION Take 1,000 mg by mouth daily. Apple cider vinegar, cayenne, ginger, maple    [provider]  aspirin 325 MG tablet Take by mouth.    [provider]  azithromycin (ZITHROMAX) 250 MG tablet Take 2 tablets now and then one tablet for 4 days. 05/26/18   Breeback, [name] L, PA-C  benzonatate (TESSALON) 200 MG capsule Take 1 capsule (200 mg total) by mouth 2 (two) times daily as needed for cough. 05/26/18   Breeback, Royetta Car, PA-C  chlorpheniramine-HYDROcodone (TUSSIONEX) 10-8 MG/5ML SUER Take 5 mLs by mouth every 12 (twelve) hours as needed for cough (cough, will cause drowsiness.). 05/26/18   Breeback, Royetta Car, PA-C  clonazePAM (KLONOPIN) 0.5 MG tablet Take 1 tablet (0.5 mg total) by mouth 2 (two) times daily as needed. for anxiety 11/24/17   Iran Planas L, PA-C  diclofenac sodium (VOLTAREN) 1 % GEL Apply 2 g topically as needed. 09/30/17   Breeback, Royetta Car, PA-C  olmesartan-hydrochlorothiazide (BENICAR HCT) 20-12.5 MG tablet  TAKE 1 TABLET BY MOUTH EVERY DAY 03/24/18   Breeback, [name] L, PA-C  pantoprazole (PROTONIX) 40 MG tablet Take 1 tablet (40 mg total) by mouth daily. 04/26/18   Breeback, Royetta Car, PA-C  ranitidine (ZANTAC) 150 MG tablet Take 1 tablet (150 mg total) by mouth 2 (two) times daily. 05/25/17   Breeback, Royetta Car, PA-C  vitamin B-12 (CYANOCOBALAMIN) 1000 MCG tablet Take 1,000 mcg by mouth daily.    [provider]    Family History Family History  Problem Relation Age of Onset  . Ovarian cancer Other        grandmother  . Heart attack Father   . Diabetes Other        grandmother     Social History Social  History   Tobacco Use  . Smoking status: Never Smoker  . Smokeless tobacco: Never Used  Substance Use Topics  . Alcohol use: No    Alcohol/week: 0.0 standard drinks  . Drug use: No     Allergies   Morphine; Oxycodone; Belviq [lorcaserin hcl]; Codeine; Contrave [naltrexone-bupropion hcl er]; Metformin and related; and Prednisone   Review of Systems Review of Systems  Skin: Positive for wound. Negative for color change.  Neurological: Positive for headaches. Negative for dizziness, syncope and light-headedness.     Physical Exam Triage Vital Signs ED Triage Vitals  Enc Vitals Group     BP      Pulse      Resp      Temp      Temp src      SpO2      Weight      Height      Head Circumference      Peak Flow      Pain Score      Pain Loc      Pain Edu?      Excl. in [place]?    No data found.  Updated Vital Signs BP 119/76 (BP Location: Right Arm)   Pulse 92   Temp 97.8 F (36.6 C) (Oral)   Resp 14   Wt 250 lb (113.4 kg)   SpO2 96%   BMI 36.92 kg/m   Visual Acuity Right Eye Distance:   Left Eye Distance:   Bilateral Distance:    Right Eye Near:   Left Eye Near:    Bilateral Near:     Physical Exam Vitals signs and nursing note reviewed.  Constitutional:      Appearance: Normal appearance. She is well-developed.  HENT:     Head: Normocephalic. Laceration present.      Comments: Top of scalp- 1cm superficial laceration. Scant red blood. No crepitus. Mildly tender. No foreign bodies seen or palpated.  Neck:     Musculoskeletal: Normal range of motion.  Cardiovascular:     Rate and Rhythm: Normal rate.  Pulmonary:     Effort: Pulmonary effort is normal.  Musculoskeletal: Normal range of motion.  Skin:    General: Skin is warm and dry.  Neurological:     General: No focal deficit present.     Mental Status: She is alert and oriented to person, place, and time.  Psychiatric:        Behavior: Behavior normal.      UC Treatments / Results   Labs (all labs ordered are listed, but only abnormal results are displayed) Labs Reviewed - No data to display  EKG None  Radiology No results found.  Procedures Laceration Repair Date/Time: 05/27/2018  10:50 AM Performed by: Noe Gens, PA-C Authorized by: Kandra Nicolas, MD   Consent:    Consent obtained:  Verbal   Consent given by:  Patient   Risks discussed:  Infection, pain and poor wound healing   Alternatives discussed:  No treatment Anesthesia (see MAR for exact dosages):    Anesthesia method:  None Laceration details:    Location:  Scalp   Scalp location:  Crown   Length (cm):  1   Depth (mm):  2 Repair type:    Repair type:  Simple Pre-procedure details:    Preparation:  Patient was prepped and draped in usual sterile fashion Exploration:    Hemostasis achieved with:  Direct pressure   Wound exploration: wound explored through full range of motion and entire depth of wound probed and visualized     Wound extent: no areolar tissue violation noted, no fascia violation noted, no foreign bodies/material noted, no muscle damage noted and no underlying fracture noted     Contaminated: no   Treatment:    Area cleansed with:  Hibiclens and saline   Amount of cleaning:  Standard Skin repair:    Repair method:  Tissue adhesive Approximation:    Approximation:  Close Post-procedure details:    Dressing:  Open (no dressing)   Patient tolerance of procedure:  Tolerated well, no immediate complications   (including critical care time)  Medications Ordered in UC Medications  Tdap (BOOSTRIX) injection 0.5 mL (0.5 mLs Intramuscular Given 05/27/18 1049)    Initial Impression / Assessment and Plan / UC Course  I have reviewed the triage vital signs and the nursing notes.  Pertinent labs & imaging results that were available during my care of the patient were reviewed by me and considered in my medical decision making (see chart for details).     Superficial  laceration to top of scalp. Wound will likely heal well on its own, however, due to pt going snorkeling next week, adhesive wound glue applied to help keep protected as it heals.  Pt started azithromycin yesterday for URI, this too should help prevent skin infection Home care info provided F/u as needed  Final Clinical Impressions(s) / UC Diagnoses   Final diagnoses:  None   Discharge Instructions   None    ED Prescriptions    None     Controlled Substance Prescriptions [place] Controlled Substance Registry consulted? Not Applicable   Noe Gens, PA-C 05/27/18 1057

## 2018-05-27 NOTE — ED Triage Notes (Signed)
Patient reports while holding up a TV it slipped and bumped into the sunglasses on her head, causing a laceration to the top of her head. Takes 325mg  ASA. No loss of consciousness. Leaving for a cruise in the AM.

## 2018-05-28 DIAGNOSIS — M10072 Idiopathic gout, left ankle and foot: Secondary | ICD-10-CM | POA: Insufficient documentation

## 2018-06-04 ENCOUNTER — Other Ambulatory Visit: Payer: Self-pay

## 2018-06-04 DIAGNOSIS — K2100 Gastro-esophageal reflux disease with esophagitis, without bleeding: Secondary | ICD-10-CM

## 2018-06-04 MED ORDER — RANITIDINE HCL 150 MG PO TABS: 150.0000 mg | ORAL_TABLET | Freq: Two times a day (BID) | ORAL | 2 refills | Status: DC

## 2018-06-08 LAB — HM MAMMOGRAPHY

## 2018-06-11 NOTE — Telephone Encounter (Signed)
Refills have been placed.

## 2018-06-16 ENCOUNTER — Other Ambulatory Visit: Payer: Self-pay

## 2018-06-16 MED ORDER — FAMOTIDINE 20 MG PO TABS: 20.0000 mg | ORAL_TABLET | Freq: Two times a day (BID) | ORAL | 11 refills | Status: DC

## 2018-06-16 NOTE — Progress Notes (Unsigned)
pepcid

## 2018-06-17 ENCOUNTER — Encounter: Payer: Self-pay | Admitting: Physician Assistant

## 2018-06-23 ENCOUNTER — Other Ambulatory Visit: Payer: Self-pay | Admitting: Physician Assistant

## 2018-06-23 DIAGNOSIS — F411 Generalized anxiety disorder: Secondary | ICD-10-CM

## 2018-06-24 ENCOUNTER — Encounter: Payer: Self-pay | Admitting: Physician Assistant

## 2018-06-24 MED ORDER — CLONAZEPAM 0.5 MG PO TABS: 0.5000 mg | ORAL_TABLET | Freq: Two times a day (BID) | ORAL | 1 refills | Status: DC | PRN

## 2018-06-24 NOTE — Telephone Encounter (Signed)
I received an Rx request for Renee Wade's clonazepam this morning. I attempted to refill it, but it printed. Just wanted to see if you want to wait to sign prescription tomorrow, or if there is a way we can send it for refill without it printing since you're not here.  Medication has been pended if refill is appropriate. Thanks!

## 2018-09-14 ENCOUNTER — Encounter: Payer: Self-pay | Admitting: Physician Assistant

## 2018-09-14 NOTE — Addendum Note (Signed)
Addended by: Beatrice Lecher D on: 09/14/2018 12:30 PM   Modules accepted: Orders

## 2018-09-15 ENCOUNTER — Other Ambulatory Visit: Payer: Self-pay

## 2018-09-15 DIAGNOSIS — F411 Generalized anxiety disorder: Secondary | ICD-10-CM

## 2018-09-15 DIAGNOSIS — I10 Essential (primary) hypertension: Secondary | ICD-10-CM

## 2018-09-15 DIAGNOSIS — K2100 Gastro-esophageal reflux disease with esophagitis, without bleeding: Secondary | ICD-10-CM

## 2018-09-15 MED ORDER — OLMESARTAN MEDOXOMIL-HCTZ 20-12.5 MG PO TABS: 1.0000 | ORAL_TABLET | Freq: Every day | ORAL | 1 refills | Status: DC

## 2018-09-15 MED ORDER — PANTOPRAZOLE SODIUM 40 MG PO TBEC: 40.0000 mg | DELAYED_RELEASE_TABLET | Freq: Every day | ORAL | 1 refills | Status: DC

## 2018-09-15 NOTE — Telephone Encounter (Signed)
Requesting RF on Clonazepam, Benicar, and pantoprazole  90 day for benicar and pantoprazole send, 90 day for clonazepam pended for provider to send

## 2018-09-15 NOTE — Telephone Encounter (Signed)
Needs virtual visit. She was due for f/u last month. Schedule with [name] for next week for virtual visit.  I needs meds ASAP then can schedule with Dr. Loni Muse.

## 2018-09-15 NOTE — Telephone Encounter (Signed)
Scheduled for Monday at 8:00 with [facility] for [facility] visit

## 2018-09-20 ENCOUNTER — Encounter: Payer: Self-pay | Admitting: Physician Assistant

## 2018-09-20 ENCOUNTER — Ambulatory Visit (INDEPENDENT_AMBULATORY_CARE_PROVIDER_SITE_OTHER): Payer: PRIVATE HEALTH INSURANCE | Admitting: Physician Assistant

## 2018-09-20 VITALS — BP 118/65 | Temp 97.7°F | Ht 69.0 in | Wt 262.0 lb

## 2018-09-20 DIAGNOSIS — K2100 Gastro-esophageal reflux disease with esophagitis, without bleeding: Secondary | ICD-10-CM

## 2018-09-20 DIAGNOSIS — F411 Generalized anxiety disorder: Secondary | ICD-10-CM | POA: Diagnosis not present

## 2018-09-20 MED ORDER — CLONAZEPAM 0.5 MG PO TABS: 0.5000 mg | ORAL_TABLET | Freq: Two times a day (BID) | ORAL | 0 refills | Status: DC | PRN

## 2018-09-20 MED ORDER — FAMOTIDINE 20 MG PO TABS: 20.0000 mg | ORAL_TABLET | Freq: Two times a day (BID) | ORAL | 3 refills | Status: DC

## 2018-09-20 NOTE — Progress Notes (Signed)
..Virtual Visit via Telephone Note  I connected with Renee Wade on 09/20/18 at  8:10 AM EDT by telephone and verified that I am speaking with the correct person using two identifiers.   I discussed the limitations, risks, security and privacy concerns of performing an evaluation and management service by telephone and the availability of in person appointments. I also discussed with the patient that there may be a patient responsible charge related to this service. The patient expressed understanding and agreed to proceed.   History of Present Illness: [name] a 58 yo female with anxiety who calls in to discuss refill of clonazempam. She is taking up to twice a day as needed. Right now she does feel like she needs it more. She is at the end of tax season and we are in the [place] pandemic. No SI/HC. She does not want to take daily medication.   .. Active Ambulatory Problems    Diagnosis Date Noted  . Esophageal stenosis 12/18/2015  . Gastroesophageal reflux disease with esophagitis 12/18/2015  . Essential hypertension, benign 12/18/2015  . History of DVT (deep vein thrombosis) 12/18/2015  . Anxiety state 12/18/2015  . CKD (chronic kidney disease) stage 3, GFR 30-59 ml/min (HCC) 12/18/2015  . Class 2 severe obesity due to excess calories with serious comorbidity and body mass index (BMI) of 38.0 to 38.9 in adult ([place]) 12/19/2015  . Ganglion cyst of wrist, left 01/08/2016  . Adenomatous polyp 02/13/2016  . It band syndrome, left 11/12/2016  . Trochanteric bursitis of left hip 11/12/2016  . 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 Dyslipidemia, goal LDL below 70 11/14/2016  . Lower extremity edema 03/17/2018  . Statin declined 04/05/2018  . Acute idiopathic gout of left foot 05/28/2018   Resolved Ambulatory Problems    Diagnosis Date Noted  . BMI 39.0-39.9,adult 02/01/2017   Past Medical History:  Diagnosis Date  . Depression   . H/O DVT    . Hypertension   . Obesity    Reviewed med, allergy, problem list.     Observations/Objective: No acute distress.   .. Today's Vitals   09/20/18 0806  BP: 118/65  Temp: 97.7 F (36.5 C)  TempSrc: Oral  Weight: 262 lb (118.8 kg)  Height: 5\' 9"  (1.753 m)   Body mass index is 38.69 kg/m.  .. Depression screen [facility] 2/9 09/20/2018 05/26/2018 10/06/2017 11/12/2016  Decreased Interest 0 0 0 0  Down, Depressed, Hopeless 0 0 0 0  PHQ - 2 Score 0 0 0 0  Altered sleeping - 1 3 -  Tired, decreased energy - 0 3 -  Change in appetite - 0 3 -  Feeling bad or failure about yourself  - 0 0 -  Trouble concentrating - 1 0 -  Moving slowly or fidgety/restless - 0 0 -  Suicidal thoughts - 0 0 -  PHQ-9 Score - 2 9 -  Difficult doing work/chores - Not difficult at all Not difficult at all -    GAD 7 : Generalized Anxiety Score 09/20/2018 05/26/2018 10/06/2017  Nervous, Anxious, on Edge 2 2 0  Control/stop worrying 2 2 0  Worry too much - different things 2 2 0  Trouble relaxing 3 2 3   Restless 2 2 0  Easily annoyed or irritable 2 2 0  Afraid - awful might happen 1 0 0  Total GAD 7 Score 14 12 3   Anxiety Difficulty Somewhat difficult Not difficult at all  Not difficult at all     Assessment and Plan: Marland [name] KitchenMarianny was seen today for medication refill.  Diagnoses and all orders for this visit:  Gastroesophageal reflux disease with esophagitis -     famotidine (PEPCID) 20 MG tablet; Take 1 tablet (20 mg total) by mouth 2 (two) times daily.  Anxiety state -     clonazePAM (KLONOPIN) 0.5 MG tablet; Take 1 tablet (0.5 mg total) by mouth 2 (two) times daily as needed. for anxiety   pepcid to replace zantac.   clonazempam refilled. Discussed options if taking daily need to consider SSRI/SSNRI or wellbutrin. Discussed dependency. Discussed other ways to manage stress and anxiety. Encouraged walking. Consider counseling. Pt declines for now.  Follow up in June.   Need to get 6 month labs then.     Follow Up Instructions:    I discussed the assessment and treatment plan with the patient. The patient was provided an opportunity to ask questions and all were answered. The patient agreed with the plan and demonstrated an understanding of the instructions.   The patient was advised to call back or seek an in-person evaluation if the symptoms worsen or if the condition fails to improve as anticipated.  I provided 10 minutes of non-face-to-face time during this encounter.   Iran Planas, PA-C

## 2018-11-25 ENCOUNTER — Ambulatory Visit (INDEPENDENT_AMBULATORY_CARE_PROVIDER_SITE_OTHER): Payer: PRIVATE HEALTH INSURANCE | Admitting: Physician Assistant

## 2018-11-25 ENCOUNTER — Encounter: Payer: Self-pay | Admitting: Physician Assistant

## 2018-11-25 VITALS — BP 125/64 | HR 70 | Temp 98.0°F | Ht 69.0 in | Wt 272.0 lb

## 2018-11-25 DIAGNOSIS — Z20822 Contact with and (suspected) exposure to covid-19: Secondary | ICD-10-CM

## 2018-11-25 DIAGNOSIS — N183 Chronic kidney disease, stage 3 unspecified: Secondary | ICD-10-CM

## 2018-11-25 DIAGNOSIS — R6889 Other general symptoms and signs: Secondary | ICD-10-CM

## 2018-11-25 DIAGNOSIS — E1122 Type 2 diabetes mellitus with diabetic chronic kidney disease: Secondary | ICD-10-CM | POA: Diagnosis not present

## 2018-11-25 DIAGNOSIS — K2100 Gastro-esophageal reflux disease with esophagitis, without bleeding: Secondary | ICD-10-CM

## 2018-11-25 DIAGNOSIS — E785 Hyperlipidemia, unspecified: Secondary | ICD-10-CM

## 2018-11-25 DIAGNOSIS — I10 Essential (primary) hypertension: Secondary | ICD-10-CM | POA: Diagnosis not present

## 2018-11-25 LAB — POCT GLYCOSYLATED HEMOGLOBIN (HGB A1C): Hemoglobin A1C: 6.8 % — AB (ref 4.0–5.6)

## 2018-11-25 MED ORDER — OZEMPIC (0.25 OR 0.5 MG/DOSE) 2 MG/1.5ML ~~LOC~~ SOPN: 0.5000 mg | PEN_INJECTOR | SUBCUTANEOUS | 2 refills | Status: DC

## 2018-11-25 NOTE — Progress Notes (Signed)
Subjective:    Patient ID: Renee Wade, female    DOB: Aug 22, 1960, 58 y.o.   MRN: [id number]  HPI [name] a 58 yo female with HTN, GERD, T2DM, CKD who presents to the clinic for 6 month follow up and medication refill.   [name] doing good. No concerns or complaints. She was losing weight but now since work has been busier she has started to gain again. She is not taking anything for her sugars. She is not checking sugars. She is not exercising. Declines any hypoglycemic events.   Pt had COVID symptoms in late December and January and numerous people in her family had same symptoms. She would like to know if she had covid.   .. Active Ambulatory Problems    Diagnosis Date Noted  . Esophageal stenosis 12/18/2015  . Gastroesophageal reflux disease with esophagitis 12/18/2015  . Essential hypertension, benign 12/18/2015  . History of DVT (deep vein thrombosis) 12/18/2015  . Anxiety state 12/18/2015  . CKD (chronic kidney disease) stage 3, GFR 30-59 ml/min (HCC) 12/18/2015  . Class 2 severe obesity due to excess calories with serious comorbidity and body mass index (BMI) of 38.0 to 38.9 in adult ([place]) 12/19/2015  . Ganglion cyst of wrist, left 01/08/2016  . Adenomatous polyp 02/13/2016  . It band syndrome, left 11/12/2016  . Trochanteric bursitis of left hip 11/12/2016  . 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 Dyslipidemia, goal LDL below 70 11/14/2016  . Lower extremity edema 03/17/2018  . Statin declined 04/05/2018  . Acute idiopathic gout of left foot 05/28/2018   Resolved Ambulatory Problems    Diagnosis Date Noted  . BMI 39.0-39.9,adult 02/01/2017   Past Medical History:  Diagnosis Date  . Depression   . H/O DVT   . Hypertension   . Obesity       Review of Systems  All other systems reviewed and are negative.      Objective:   Physical Exam Vitals signs reviewed.  Constitutional:      Appearance: Normal appearance.   HENT:     Head: Normocephalic and atraumatic.  Cardiovascular:     Rate and Rhythm: Normal rate and regular rhythm.  Pulmonary:     Effort: Pulmonary effort is normal.     Breath sounds: Normal breath sounds.  Neurological:     General: No focal deficit present.     Mental Status: She is alert and oriented to person, place, and time.  Psychiatric:        Mood and Affect: Mood normal.        Behavior: Behavior normal.           Assessment & Plan:  Marland [name] KitchenNida was seen today for diabetes.  Diagnoses and all orders for this visit:  Type 2 diabetes mellitus with stage 3 chronic kidney disease, without long-term current use of insulin (HCC) -     POCT glycosylated hemoglobin (Hb A1C) -     Semaglutide,0.25 or 0.5MG /DOS, (OZEMPIC, 0.25 OR 0.5 MG/DOSE,) 2 MG/1.5ML SOPN; Inject 0.5 mg into the skin once a week.  Essential hypertension, benign  Gastroesophageal reflux disease with esophagitis  Dyslipidemia, goal LDL below 70  Suspected Covid-19 Virus Infection -     SAR CoV2 Serology (COVID 19)AB(IGG)IA     .Marland Kitchen Results for orders placed or performed in visit on 11/25/18  SAR CoV2 Serology (COVID 19)AB(IGG)IA   Specimen: Blood  Result Value Ref Range   SARS  CoV2 AB IGG NEGATIVE   POCT glycosylated hemoglobin (Hb A1C)  Result Value Ref Range   Hemoglobin A1C 6.8 (A) 4.0 - 5.6 %   HbA1c POC (<> result, manual entry)     HbA1c, POC (prediabetic range)     HbA1c, POC (controlled diabetic range)      LDL 106. Pt declines statin. She is aware of risk.  Will start ozempic for sugar control and weight loss.  BP looks great.  Discussed DM diet and exercise.  Follow up in 3 months.   IgG testing for covid ordered.

## 2018-11-26 ENCOUNTER — Ambulatory Visit: Payer: PRIVATE HEALTH INSURANCE | Admitting: Physician Assistant

## 2018-11-26 LAB — SAR COV2 SEROLOGY (COVID19)AB(IGG),IA: SARS CoV2 AB IGG: NEGATIVE

## 2018-11-29 NOTE — Progress Notes (Signed)
Call pt: negative for any antibodies detected to COVID-19. Means by this testing you have never had coronavirus.

## 2018-12-06 ENCOUNTER — Other Ambulatory Visit: Payer: Self-pay | Admitting: Physician Assistant

## 2018-12-06 DIAGNOSIS — F411 Generalized anxiety disorder: Secondary | ICD-10-CM

## 2018-12-06 MED ORDER — CLONAZEPAM 0.5 MG PO TABS: 0.5000 mg | ORAL_TABLET | Freq: Two times a day (BID) | ORAL | 0 refills | Status: DC | PRN

## 2018-12-06 NOTE — Telephone Encounter (Signed)
RX filled 09/20/2018 for 3 month supply.  Last appt 11/25/2018. Please advise.

## 2019-01-16 ENCOUNTER — Other Ambulatory Visit: Payer: Self-pay | Admitting: Physician Assistant

## 2019-01-16 DIAGNOSIS — K2100 Gastro-esophageal reflux disease with esophagitis, without bleeding: Secondary | ICD-10-CM

## 2019-01-16 DIAGNOSIS — I10 Essential (primary) hypertension: Secondary | ICD-10-CM

## 2019-01-31 ENCOUNTER — Encounter: Payer: Self-pay | Admitting: Physician Assistant

## 2019-01-31 NOTE — Telephone Encounter (Signed)
Please call [name] at [phone] to schedule an appt for her father Jaclyn Shaggy, DOB: 03/06/38)

## 2019-01-31 NOTE — Telephone Encounter (Signed)
Patient wanted to know if father needed an appointment or if medication can be filled without it? If he does not need an appointment daughter would like this sent to [facility]. Please advise, daughter asked to call back.

## 2019-02-20 ENCOUNTER — Other Ambulatory Visit: Payer: Self-pay | Admitting: Physician Assistant

## 2019-02-20 DIAGNOSIS — F411 Generalized anxiety disorder: Secondary | ICD-10-CM

## 2019-02-25 ENCOUNTER — Other Ambulatory Visit: Payer: Self-pay

## 2019-02-25 ENCOUNTER — Ambulatory Visit (INDEPENDENT_AMBULATORY_CARE_PROVIDER_SITE_OTHER): Payer: PRIVATE HEALTH INSURANCE | Admitting: Physician Assistant

## 2019-02-25 ENCOUNTER — Encounter: Payer: Self-pay | Admitting: Physician Assistant

## 2019-02-25 VITALS — BP 137/64 | HR 64 | Ht 69.0 in | Wt 268.0 lb

## 2019-02-25 DIAGNOSIS — Z6839 Body mass index (BMI) 39.0-39.9, adult: Secondary | ICD-10-CM

## 2019-02-25 DIAGNOSIS — F411 Generalized anxiety disorder: Secondary | ICD-10-CM

## 2019-02-25 DIAGNOSIS — K2100 Gastro-esophageal reflux disease with esophagitis, without bleeding: Secondary | ICD-10-CM

## 2019-02-25 DIAGNOSIS — N951 Menopausal and female climacteric states: Secondary | ICD-10-CM

## 2019-02-25 DIAGNOSIS — I10 Essential (primary) hypertension: Secondary | ICD-10-CM | POA: Diagnosis not present

## 2019-02-25 DIAGNOSIS — N183 Chronic kidney disease, stage 3 (moderate): Secondary | ICD-10-CM | POA: Diagnosis not present

## 2019-02-25 DIAGNOSIS — E1122 Type 2 diabetes mellitus with diabetic chronic kidney disease: Secondary | ICD-10-CM | POA: Diagnosis not present

## 2019-02-25 DIAGNOSIS — E66812 Obesity, class 2: Secondary | ICD-10-CM

## 2019-02-25 LAB — POCT GLYCOSYLATED HEMOGLOBIN (HGB A1C): Hemoglobin A1C: 6.5 % — AB (ref 4.0–5.6)

## 2019-02-25 MED ORDER — PANTOPRAZOLE SODIUM 40 MG PO TBEC: 40.0000 mg | DELAYED_RELEASE_TABLET | Freq: Every day | ORAL | 3 refills | Status: DC

## 2019-02-25 MED ORDER — OLMESARTAN MEDOXOMIL-HCTZ 20-12.5 MG PO TABS: 1.0000 | ORAL_TABLET | Freq: Every day | ORAL | 3 refills | Status: DC

## 2019-02-25 NOTE — Patient Instructions (Signed)
[place]

## 2019-02-25 NOTE — Progress Notes (Addendum)
Subjective:    Patient ID: Renee Wade, female    DOB: Sep 26, 1960, 58 y.o.   MRN: [id number]  HPI She presents for her follow-up diabetic visit. She has T2DM, HTN, CKD, and GERD. Reports that she has not been checking her blood sugar levels. Started ozempic but experienced nausea and could not tolerate, so she stopped taking it about a month ago. She has never been able to tolerate metformin.  Denies any hypoglycemic associated symptoms, vision changes, loss of sensation. States that she does not exercise or follow any diet to promote weight loss. Foot exam UTD, eye exam is coming up.   She denies any CP, vision changes, headaches.   Her anxiety is ok. She continues to use clonazepam as needed and usually once a day.   She would like accunpuncture referral. In the past accunpuncture has helped menopausal symptoms and even helped with weight/appetitie control.   .. Active Ambulatory Problems    Diagnosis Date Noted  . Esophageal stenosis 12/18/2015  . Gastroesophageal reflux disease with esophagitis 12/18/2015  . Essential hypertension, benign 12/18/2015  . History of DVT (deep vein thrombosis) 12/18/2015  . Anxiety state 12/18/2015  . CKD (chronic kidney disease) stage 3, GFR 30-59 ml/min (HCC) 12/18/2015  . Class 2 severe obesity due to excess calories with serious comorbidity and body mass index (BMI) of 39.0 to 39.9 in adult ([place]) 12/19/2015  . Ganglion cyst of wrist, left 01/08/2016  . Adenomatous polyp 02/13/2016  . It band syndrome, left 11/12/2016  . Trochanteric bursitis of left hip 11/12/2016  . 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 Dyslipidemia, goal LDL below 70 11/14/2016  . Lower extremity edema 03/17/2018  . Statin declined 04/05/2018  . Acute idiopathic gout of left foot 05/28/2018  . Menopausal symptoms 02/28/2019   Resolved Ambulatory Problems    Diagnosis Date Noted  . BMI 39.0-39.9,adult 02/01/2017   Past  Medical History:  Diagnosis Date  . Depression   . H/O DVT   . Hypertension   . Obesity      Review of Systems  Constitutional: Negative for chills and fever.  HENT: Negative for tinnitus.        Denies headache.   Eyes:       Denies vision changes.    Respiratory: Negative for shortness of breath.   Cardiovascular: Negative for chest pain and palpitations.  Skin:       Denies skin breakdown, ulcer formation.   Neurological: Negative for weakness and light-headedness.       Objective:   Physical Exam Constitutional:      Appearance: Normal appearance.  HENT:     Head: Normocephalic and atraumatic.  Eyes:     Extraocular Movements: Extraocular movements intact.     Conjunctiva/sclera: Conjunctivae normal.  Cardiovascular:     Rate and Rhythm: Normal rate and regular rhythm.     Heart sounds: Normal heart sounds.  Pulmonary:     Effort: Pulmonary effort is normal.     Breath sounds: Normal breath sounds.  Skin:    General: Skin is warm and dry.  Neurological:     Mental Status: She is alert and oriented to person, place, and time.    .. Results for orders placed or performed in visit on 02/25/19  POCT glycosylated hemoglobin (Hb A1C)  Result Value Ref Range   Hemoglobin A1C 6.5 (A) 4.0 - 5.6 %   HbA1c POC (<> result, manual entry)  HbA1c, POC (prediabetic range)     HbA1c, POC (controlled diabetic range)        Assessment & Plan:  Marland [name] KitchenBelva was seen today for diabetes.  Diagnoses and all orders for this visit:  Type 2 diabetes mellitus with stage 3 chronic kidney disease, without long-term current use of insulin (HCC) -     POCT glycosylated hemoglobin (Hb A1C)  Anxiety state  Gastroesophageal reflux disease with esophagitis -     pantoprazole (PROTONIX) 40 MG tablet; Take 1 tablet (40 mg total) by mouth daily.  Essential hypertension, benign -     olmesartan-hydrochlorothiazide (BENICAR HCT) 20-12.5 MG tablet; Take 1 tablet by mouth  daily.  Menopausal symptoms -     Ambulatory referral to Integrative Medicine  Class 2 severe obesity due to excess calories with serious comorbidity and body mass index (BMI) of 39.0 to 39.9 in adult [facility]) -     Ambulatory referral to Integrative Medicine   A!C consider controlled.  Discussed adding wheat grass as natural approach to controlling sugars.  Strongly consider diabetic diet and regularly exercise.  On ARB. Goal BP under 130/80.  Pt declines statin. LDL 106 last check.  Denied flu vaccine.  Reminder that eye exam is due 11/13, pt stated they will call her to set an appt.   Pt will call and schedule colonoscopy this week.   Patient stated that she wanted to start acupuncture and was looking for some recommendations. She said she had an acupuncturist after her hysterectomy and felt that it really helped. Will make referral.   Does not need clonazepam/protonix/pepcid refill at this time.   Marland KitchenVernetta Honey PA-C, have reviewed and agree with the above documentation in it's entirety.

## 2019-02-25 NOTE — Progress Notes (Deleted)
   Subjective:    Patient ID: Renee Wade, female    DOB: 1961/03/23, 58 y.o.   MRN: [id number]  HPI 2009 accunpuncte 235.   6 months.    Review of Systems     Objective:   Physical Exam        Assessment & Plan:

## 2019-02-28 ENCOUNTER — Encounter: Payer: Self-pay | Admitting: Physician Assistant

## 2019-02-28 DIAGNOSIS — N951 Menopausal and female climacteric states: Secondary | ICD-10-CM | POA: Insufficient documentation

## 2019-03-21 ENCOUNTER — Emergency Department
Admission: EM | Admit: 2019-03-21 | Discharge: 2019-03-21 | Disposition: A | Discharge status: Home / Self Care | Payer: BLUE CROSS/BLUE SHIELD | Source: Home / Self Care

## 2019-03-21 ENCOUNTER — Emergency Department (INDEPENDENT_AMBULATORY_CARE_PROVIDER_SITE_OTHER): Payer: BLUE CROSS/BLUE SHIELD

## 2019-03-21 ENCOUNTER — Other Ambulatory Visit: Payer: Self-pay

## 2019-03-21 DIAGNOSIS — S61411A Laceration without foreign body of right hand, initial encounter: Secondary | ICD-10-CM

## 2019-03-21 DIAGNOSIS — M1711 Unilateral primary osteoarthritis, right knee: Secondary | ICD-10-CM | POA: Diagnosis not present

## 2019-03-21 DIAGNOSIS — M79621 Pain in right upper arm: Secondary | ICD-10-CM | POA: Diagnosis not present

## 2019-03-21 DIAGNOSIS — W010XXA Fall on same level from slipping, tripping and stumbling without subsequent striking against object, initial encounter: Secondary | ICD-10-CM | POA: Diagnosis not present

## 2019-03-21 DIAGNOSIS — S8991XA Unspecified injury of right lower leg, initial encounter: Secondary | ICD-10-CM

## 2019-03-21 DIAGNOSIS — M25461 Effusion, right knee: Secondary | ICD-10-CM | POA: Diagnosis not present

## 2019-03-21 NOTE — ED Triage Notes (Signed)
Pt here tonight after taking a fall outside front stoop of home. Caught arm on wooden bench causing a small laceration on base of RT thumb. Says she pulled a piece of wood out of it. Pt also c/o of pain in RT upper arm and RT knee. Previously has had knee surgery in same knee. Tdap up to date.

## 2019-03-21 NOTE — Discharge Instructions (Signed)
°  You may elevate your leg 2-3 times daily for 15-20 minutes at a time, more often if able. Please follow up with your primary care provider and sports medicine later this week for further evaluation of your knee injury, especially if not improving.

## 2019-03-21 NOTE — ED Provider Notes (Signed)
[facility]    CSN: [id number] Arrival date & time: 03/21/19  1926      History   Chief Complaint Chief Complaint  Patient presents with  . Knee Pain    RT  . Arm Pain    RT  . Thumb Laceration    HPI Renee Wade is a 58 y.o. female.   HPI Renee Wade is a 58 y.o. female presenting to UC with c/o sudden onset Right upper arm pain, laceration to Right hand, and Right knee pain with swelling that started after she tripped and fell on her porch, catching her hand and arm in a wood bench. She notes her father removing a piece of wound from the cut on her Right hand but is not sure all the wood came out.  She is most concerned about her Right knee due to prior meniscal tear and repair over 10 years ago as well as a blood clot in that leg around that time.  She is not on blood thinners.  Bleeding controlled PTA. [name] using a walking cane this evening due to Right knee injury but she does not typically use a cane or walker.  Denies hitting her head or LOC. Tdap updated last year, 2019 after cutting her head.    Past Medical History:  Diagnosis Date  . Depression   . H/O DVT    S/p surgery/hysterectomy in 2008  . Hypertension   . Obesity     Patient Active Problem List   Diagnosis Date Noted  . Menopausal symptoms 02/28/2019  . Acute idiopathic gout of left foot 05/28/2018  . Statin declined 04/05/2018  . Lower extremity edema 03/17/2018  . Dyslipidemia, goal LDL below 70 11/14/2016  . 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 It band syndrome, left 11/12/2016  . Trochanteric bursitis of left hip 11/12/2016  . Adenomatous polyp 02/13/2016  . Ganglion cyst of wrist, left 01/08/2016  . Class 2 severe obesity due to excess calories with serious comorbidity and body mass index (BMI) of 39.0 to 39.9 in adult ([place]) 12/19/2015  . Esophageal stenosis 12/18/2015  . Gastroesophageal reflux disease with esophagitis  12/18/2015  . Essential hypertension, benign 12/18/2015  . History of DVT (deep vein thrombosis) 12/18/2015  . Anxiety state 12/18/2015  . CKD (chronic kidney disease) stage 3, GFR 30-59 ml/min 12/18/2015    Past Surgical History:  Procedure Laterality Date  . ABDOMINAL HYSTERECTOMY  2009  . CESAREAN SECTION  1989  . TONSILLECTOMY  1972  . WISDOM TOOTH EXTRACTION  1989    OB History   No obstetric history on file.      Home Medications    Prior to Admission medications   Medication Sig Start Date End Date Taking? Authorizing Provider  APPLE CIDER VINEGAR PO Take by mouth daily.    [provider]  aspirin 325 MG tablet Take by mouth.    [provider]  clonazePAM (KLONOPIN) 0.5 MG tablet TAKE 1 TABLET BY MOUTH  TWICE DAILY AS NEEDED FOR  ANXIETY 02/21/19   Breeback, [name] L, PA-C  diclofenac sodium (VOLTAREN) 1 % GEL Apply 2 g topically as needed. 09/30/17   Breeback, [name] L, PA-C  famotidine (PEPCID) 20 MG tablet Take 1 tablet (20 mg total) by mouth 2 (two) times daily. 09/20/18   Breeback, [name] L, PA-C  Ginger, Zingiber officinalis, (GINGER PO) Take by mouth daily.    [provider]  Misc Natural Products (TART CHERRY ADVANCED PO) Take by mouth daily.    [provider]  olmesartan-hydrochlorothiazide (BENICAR HCT) 20-12.5 MG tablet Take 1 tablet by mouth daily. 02/25/19   Breeback, [name] L, PA-C  Omega 3-6-9 Fatty Acids (OMEGA 3-6-9 COMPLEX PO) Take by mouth daily.    [provider]  pantoprazole (PROTONIX) 40 MG tablet Take 1 tablet (40 mg total) by mouth daily. 02/25/19   Donella Stade, PA-C    Family History Family History  Problem Relation Age of Onset  . Ovarian cancer Other        grandmother  . Heart attack Father   . Diabetes Other        grandmother     Social History Social History   Tobacco Use  . Smoking status: Never Smoker  . Smokeless tobacco: Never Used  Substance Use Topics  . Alcohol use: No     Alcohol/week: 0.0 standard drinks  . Drug use: No     Allergies   Morphine, Oxycodone, Belviq [lorcaserin hcl], Codeine, Contrave [naltrexone-bupropion hcl er], Metformin and related, Ozempic (0.25 or 0.5 mg-dose) [semaglutide(0.25 or 0.5mg -dos)], and Prednisone   Review of Systems Review of Systems  Musculoskeletal: Positive for arthralgias, gait problem, joint swelling and myalgias. Negative for back pain.  Skin: Positive for color change and wound.  Neurological: Positive for weakness (Right arm and knee due to pain and swelling,). Negative for dizziness, numbness and headaches.     Physical Exam Triage Vital Signs ED Triage Vitals [03/21/19 2003]  Enc Vitals Group     BP      Pulse      Resp      Temp      Temp src      SpO2      Weight      Height      Head Circumference      Peak Flow      Pain Score 4     Pain Loc      Pain Edu?      Excl. in [place]?    No data found.  Updated Vital Signs BP 137/67 (BP Location: Right Arm)   Pulse (!) 109   Temp 97.7 F (36.5 C) (Oral)   Resp 18   SpO2 100%   Visual Acuity Right Eye Distance:   Left Eye Distance:   Bilateral Distance:    Right Eye Near:   Left Eye Near:    Bilateral Near:     Physical Exam Vitals signs and nursing note reviewed.  Constitutional:      Appearance: Normal appearance. She is well-developed.     Comments: Pt sitting in exam chair, ice pack on Right knee, walking cane by her side  HENT:     Head: Normocephalic and atraumatic.  Neck:     Musculoskeletal: Normal range of motion.  Cardiovascular:     Rate and Rhythm: Normal rate and regular rhythm.  Pulmonary:     Effort: Pulmonary effort is normal. No respiratory distress.     Breath sounds: Normal breath sounds.  Chest:     Chest wall: No tenderness.  Musculoskeletal: Normal range of motion.        General: Swelling, tenderness and signs of injury present.     Comments: Right arm: full ROM, no bony tenderness to shoulder, elbow,  wrist or hand. Tenderness to triceps and biceps. No mass or deformity. 5/5 grip strength.  Right knee: mild to moderate edema, tenderness  to anterior aspect and both joint spaces. Increased pain on full flexion. Calf is soft, non-tender.  Skin:    General: Skin is warm and dry.     Findings: Bruising present.     Comments: Right knee: moderate edema, mild ecchymosis to anterior and inferior aspect. Skin in tact.  Right hand: 2cm superficial laceration, scant brown debris in edges of wound. No active bleeding.   Neurological:     Mental Status: She is alert and oriented to person, place, and time.  Psychiatric:        Behavior: Behavior normal.      UC Treatments / Results  Labs (all labs ordered are listed, but only abnormal results are displayed) Labs Reviewed - No data to display  EKG   Radiology Dg Knee Complete 4 Views Right  Result Date: 03/21/2019 CLINICAL DATA:  Knee pain and swelling EXAM: RIGHT KNEE - COMPLETE 4+ VIEW COMPARISON:  None. FINDINGS: No fracture or malalignment. Mild joint space narrowing and osteophyte medial joint space. Moderate patellofemoral degenerative change. Trace knee effusion. Soft tissue thickening in the infrapatellar region. No radiopaque foreign body. IMPRESSION: 1. No acute osseous abnormality 2. Arthritis of the knee with trace knee effusion. Infrapatellar soft tissue edema Electronically Signed   By: Donavan Foil M.D.   On: 03/21/2019 20:34    Procedures Laceration Repair  Date/Time: 03/22/2019 11:27 AM Performed by: Noe Gens, PA-C Authorized by: Noe Gens, PA-C   Consent:    Consent obtained:  Verbal   Consent given by:  Patient   Risks discussed:  Infection, pain, poor cosmetic result, retained foreign body, poor wound healing, need for additional repair and nerve damage   Alternatives discussed:  Delayed treatment and no treatment Anesthesia (see MAR for exact dosages):    Anesthesia method:  None Laceration details:     Location:  Hand   Hand location:  R palm   Length (cm):  2   Depth (mm):  3 Repair type:    Repair type:  Simple Pre-procedure details:    Preparation:  Patient was prepped and draped in usual sterile fashion Exploration:    Hemostasis achieved with:  Direct pressure   Wound exploration: wound explored through full range of motion and entire depth of wound probed and visualized     Wound extent: foreign bodies/material     Wound extent: no areolar tissue violation noted, no fascia violation noted, no muscle damage noted, no nerve damage noted, no tendon damage noted, no underlying fracture noted and no vascular damage noted     Foreign bodies/material:  Dirt/moist wood shavings? scant amount on wound edges   Contaminated: yes   Treatment:    Area cleansed with:  Hibiclens and saline   Amount of cleaning:  Standard (used sterile guaze to [employer] remove dirt/remaining wood debris) Skin repair:    Repair method:  Steri-Strips   Number of Steri-Strips:  2 Approximation:    Approximation:  Close Post-procedure details:    Dressing:  Bulky dressing   Patient tolerance of procedure:  Tolerated well, no immediate complications   (including critical care time)  Medications Ordered in UC Medications - No data to display  Initial Impression / Assessment and Plan / UC Course  I have reviewed the triage vital signs and the nursing notes.  Pertinent labs & imaging results that were available during my care of the patient were reviewed by me and considered in my medical decision making (see chart for details).  Right upper arm pain most c/w muscle strain, no bony tenderness on exam, full ROM. No indication for urgent imaging at this time of shoulder or elbow  Right hand: laceration cleaned and closed as noted above. Wound not sutured closed due to scant debris still present in wound.  Encouraged to monitor the healing.   Right knee: discussed imaging with pt. Offered knee wrap or  brace and crutches, pt declined. She will continue to use her walking cane and has knee wraps at home from prior knee surgery.   Encouraged f/u with PCP and Sports Medicine later this week for further evaluation and recheck of hand wound. AVS provided  Final Clinical Impressions(s) / UC Diagnoses   Final diagnoses:  Fall from slip, trip, or stumble, initial encounter  Laceration of right hand without foreign body, initial encounter  Right knee injury, initial encounter  Pain in right upper arm     Discharge Instructions      You may elevate your leg 2-3 times daily for 15-20 minutes at a time, more often if able. Please follow up with your primary care provider and sports medicine later this week for further evaluation of your knee injury, especially if not improving.     ED Prescriptions    None     PDMP not reviewed this encounter.   Noe Gens, PA-C 03/22/19 1134

## 2019-05-12 DIAGNOSIS — D122 Benign neoplasm of ascending colon: Secondary | ICD-10-CM | POA: Diagnosis not present

## 2019-05-12 DIAGNOSIS — Z1211 Encounter for screening for malignant neoplasm of colon: Secondary | ICD-10-CM | POA: Diagnosis not present

## 2019-05-12 DIAGNOSIS — K635 Polyp of colon: Secondary | ICD-10-CM | POA: Diagnosis not present

## 2019-05-12 DIAGNOSIS — Z86010 Personal history of colonic polyps: Secondary | ICD-10-CM | POA: Diagnosis not present

## 2019-05-12 DIAGNOSIS — K5730 Diverticulosis of large intestine without perforation or abscess without bleeding: Secondary | ICD-10-CM | POA: Diagnosis not present

## 2019-05-12 LAB — HM COLONOSCOPY

## 2019-05-18 ENCOUNTER — Encounter: Payer: Self-pay | Admitting: Physician Assistant

## 2019-05-19 ENCOUNTER — Encounter: Payer: Self-pay | Admitting: Physician Assistant

## 2019-05-20 MED ORDER — COLCHICINE 0.6 MG PO TABS: ORAL_TABLET | ORAL | 2 refills | Status: DC

## 2019-06-08 ENCOUNTER — Other Ambulatory Visit: Payer: Self-pay | Admitting: Physician Assistant

## 2019-06-08 DIAGNOSIS — K2100 Gastro-esophageal reflux disease with esophagitis, without bleeding: Secondary | ICD-10-CM

## 2019-06-09 ENCOUNTER — Other Ambulatory Visit: Payer: Self-pay | Admitting: Neurology

## 2019-06-09 DIAGNOSIS — F411 Generalized anxiety disorder: Secondary | ICD-10-CM

## 2019-06-09 MED ORDER — CLONAZEPAM 0.5 MG PO TABS: 0.5000 mg | ORAL_TABLET | Freq: Two times a day (BID) | ORAL | 0 refills | Status: DC | PRN

## 2019-06-09 NOTE — Telephone Encounter (Signed)
Last filled 02/21/2019 #180 (3 month supply) with no refills.  Last appt 02/25/2019.

## 2019-06-13 MED ORDER — CLONAZEPAM 0.5 MG PO TABS: 0.5000 mg | ORAL_TABLET | Freq: Two times a day (BID) | ORAL | 0 refills | Status: DC | PRN

## 2019-06-13 NOTE — Telephone Encounter (Signed)
[facility] called and states they never got the digital signature and asked for [place] to resend. Please resign.

## 2019-06-13 NOTE — Progress Notes (Signed)
Famotidine not on patient formulary.

## 2019-06-13 NOTE — Addendum Note (Signed)
Addended byAnnamaria Helling on: 06/13/2019 11:18 AM   Modules accepted: Orders

## 2019-06-15 ENCOUNTER — Encounter: Payer: Self-pay | Admitting: Physician Assistant

## 2019-06-16 ENCOUNTER — Encounter: Payer: Self-pay | Admitting: Physician Assistant

## 2019-06-16 ENCOUNTER — Other Ambulatory Visit: Payer: Self-pay | Admitting: Physician Assistant

## 2019-06-16 DIAGNOSIS — F411 Generalized anxiety disorder: Secondary | ICD-10-CM

## 2019-06-17 NOTE — Telephone Encounter (Signed)
I just sent this 4 days ago.

## 2019-06-17 NOTE — Telephone Encounter (Signed)
Refill has already been taken care of.   Please address PA.  Thanks!

## 2019-06-17 NOTE — Telephone Encounter (Signed)
What medication are we talking about? Clonazepam?

## 2019-09-05 ENCOUNTER — Other Ambulatory Visit: Payer: Self-pay | Admitting: Neurology

## 2019-09-05 DIAGNOSIS — F411 Generalized anxiety disorder: Secondary | ICD-10-CM

## 2019-09-05 MED ORDER — CLONAZEPAM 0.5 MG PO TABS: 0.5000 mg | ORAL_TABLET | Freq: Two times a day (BID) | ORAL | 0 refills | Status: DC | PRN

## 2019-09-05 NOTE — Telephone Encounter (Signed)
Call pt: Only sent over 2-week supply to her local pharmacy.  She is overdue for a follow-up and has not been seen in over 6 months.

## 2019-09-05 NOTE — Telephone Encounter (Signed)
Last filled 06/13/2019 #180 with no refills.  Last appt 02/25/2019. Scheduled 09/23/2019.

## 2019-09-06 NOTE — Telephone Encounter (Signed)
Patient made aware.

## 2019-09-23 ENCOUNTER — Other Ambulatory Visit: Payer: Self-pay

## 2019-09-23 ENCOUNTER — Encounter: Payer: Self-pay | Admitting: Physician Assistant

## 2019-09-23 ENCOUNTER — Ambulatory Visit (INDEPENDENT_AMBULATORY_CARE_PROVIDER_SITE_OTHER): Payer: BLUE CROSS/BLUE SHIELD | Admitting: Physician Assistant

## 2019-09-23 VITALS — BP 119/65 | HR 80 | Temp 97.9°F | Ht 69.0 in | Wt 278.0 lb

## 2019-09-23 DIAGNOSIS — N1832 Chronic kidney disease, stage 3b: Secondary | ICD-10-CM

## 2019-09-23 DIAGNOSIS — R059 Cough, unspecified: Secondary | ICD-10-CM

## 2019-09-23 DIAGNOSIS — N183 Chronic kidney disease, stage 3 unspecified: Secondary | ICD-10-CM | POA: Diagnosis not present

## 2019-09-23 DIAGNOSIS — E1122 Type 2 diabetes mellitus with diabetic chronic kidney disease: Secondary | ICD-10-CM

## 2019-09-23 DIAGNOSIS — F411 Generalized anxiety disorder: Secondary | ICD-10-CM | POA: Diagnosis not present

## 2019-09-23 DIAGNOSIS — Z77098 Contact with and (suspected) exposure to other hazardous, chiefly nonmedicinal, chemicals: Secondary | ICD-10-CM | POA: Diagnosis not present

## 2019-09-23 DIAGNOSIS — I10 Essential (primary) hypertension: Secondary | ICD-10-CM | POA: Diagnosis not present

## 2019-09-23 DIAGNOSIS — K2100 Gastro-esophageal reflux disease with esophagitis, without bleeding: Secondary | ICD-10-CM | POA: Diagnosis not present

## 2019-09-23 DIAGNOSIS — R05 Cough: Secondary | ICD-10-CM

## 2019-09-23 DIAGNOSIS — R062 Wheezing: Secondary | ICD-10-CM

## 2019-09-23 LAB — POCT GLYCOSYLATED HEMOGLOBIN (HGB A1C): Hemoglobin A1C: 7.2 % — AB (ref 4.0–5.6)

## 2019-09-23 MED ORDER — SITAGLIPTIN PHOSPHATE 100 MG PO TABS: 100.0000 mg | ORAL_TABLET | Freq: Every day | ORAL | 1 refills | Status: DC

## 2019-09-23 MED ORDER — FAMOTIDINE 20 MG PO TABS: 20.0000 mg | ORAL_TABLET | Freq: Two times a day (BID) | ORAL | 3 refills | Status: DC

## 2019-09-23 MED ORDER — METHYLPREDNISOLONE SODIUM SUCC 125 MG IJ SOLR
125.0000 mg | Freq: Once | INTRAMUSCULAR | Status: AC
  Administered 2019-09-23: 125 mg via INTRAMUSCULAR

## 2019-09-23 MED ORDER — CLONAZEPAM 0.5 MG PO TABS: 0.5000 mg | ORAL_TABLET | Freq: Two times a day (BID) | ORAL | 1 refills | Status: DC | PRN

## 2019-09-23 NOTE — Progress Notes (Signed)
Subjective:    Patient ID: Renee Wade, female    DOB: 1960-06-12, 59 y.o.   MRN: [id number]  HPI  [name] a 59 yo obese female with T2DM, GERD, HTN, CKD who presents to the clinic for 3 month follow up.   DM-not checking sugars. Not eating right. In tax season and overworked and not keeping healthy diet. She is taking [place] daily. No hypoglycemia. No open sores or wounds.   HTN- no CP, palpitations, headaches or vision changes. Taking Benicar daily.   GERD- controlled with pepcid and protonix. She is seeing an acupuncture and helping with GERD control.   Anxiety is fairly well controlled she does need refill on clonazepam to use as needed. No SI/HC. 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   Pt did inhale some chemical fertilizer a few days ago and been coughing and wheezing since. No fever, chills, sinus pressure, headache.   .. Active Ambulatory Problems    Diagnosis Date Noted  . Esophageal stenosis 12/18/2015  . Gastroesophageal reflux disease with esophagitis 12/18/2015  . Essential hypertension, benign 12/18/2015  . History of DVT (deep vein thrombosis) 12/18/2015  . Anxiety state 12/18/2015  . CKD (chronic kidney disease) stage 3, GFR 30-59 ml/min 12/18/2015  . Class 2 severe obesity due to excess calories with serious comorbidity and body mass index (BMI) of 39.0 to 39.9 in adult ([place]) 12/19/2015  . Ganglion cyst of wrist, left 01/08/2016  . Adenomatous polyp 02/13/2016  . It band syndrome, left 11/12/2016  . Trochanteric bursitis of left hip 11/12/2016  . 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 Dyslipidemia, goal LDL below 70 11/14/2016  . Lower extremity edema 03/17/2018  . Statin declined 04/05/2018  . Acute idiopathic gout of left foot 05/28/2018  . Menopausal symptoms 02/28/2019   Resolved Ambulatory Problems    Diagnosis Date Noted  . BMI 39.0-39.9,adult 02/01/2017   Past Medical History:  Diagnosis Date  . Depression   . H/O  DVT   . Hypertension   . Obesity       Review of Systems See HPI.     Objective:   Physical Exam Vitals reviewed.  Constitutional:      Appearance: Normal appearance. She is obese.  Neck:     Vascular: No carotid bruit.  Cardiovascular:     Rate and Rhythm: Normal rate and regular rhythm.     Pulses: Normal pulses.  Pulmonary:     Effort: Pulmonary effort is normal.     Comments: Bilateral upper lung wheezing.  Musculoskeletal:     Right lower leg: No edema.     Left lower leg: No edema.  Neurological:     General: No focal deficit present.     Mental Status: She is alert and oriented to person, place, and time.  Psychiatric:        Mood and Affect: Mood normal.           Assessment & Plan:  Renee Wade [name] KitchenAlexea was seen today for diabetes.  Diagnoses and all orders for this visit:  Type 2 diabetes mellitus with stage 3 chronic kidney disease, without long-term current use of insulin, unspecified whether stage 3a or 3b CKD (HCC) -     POCT glycosylated hemoglobin (Hb A1C) -     Lipid Panel w/reflex Direct LDL -     sitaGLIPtin (JANUVIA) 100 MG tablet; Take 1 tablet (100 mg total) by mouth daily.  Anxiety state -  clonazePAM (KLONOPIN) 0.5 MG tablet; Take 1 tablet (0.5 mg total) by mouth 2 (two) times daily as needed. for anxiety  Gastroesophageal reflux disease with esophagitis -     famotidine (PEPCID) 20 MG tablet; Take 1 tablet (20 mg total) by mouth 2 (two) times daily.  Essential hypertension, benign -     COMPLETE METABOLIC PANEL WITH GFR  Cough  Wheezing -     methylPREDNISolone sodium succinate (SOLU-MEDROL) 125 mg/2 mL injection 125 mg  Exposure to chemical inhalation -     methylPREDNISolone sodium succinate (SOLU-MEDROL) 125 mg/2 mL injection 125 mg  Stage 3b chronic kidney disease -     COMPLETE METABOLIC PANEL WITH GFR   .Renee Wade Results for orders placed or performed in visit on 09/23/19  COMPLETE METABOLIC PANEL WITH GFR  Result Value Ref Range    Glucose, Bld 169 (H) 65 - 99 mg/dL   BUN 21 7 - 25 mg/dL   Creat 1.52 (H) 0.50 - 1.05 mg/dL   GFR, Est Non African American 37 (L) > OR = 60 mL/min/1.51m2   GFR, Est African American 43 (L) > OR = 60 mL/min/1.32m2   BUN/Creatinine Ratio 14 6 - 22 (calc)   Sodium 139 135 - 146 mmol/L   Potassium 3.8 3.5 - 5.3 mmol/L   Chloride 103 98 - 110 mmol/L   CO2 30 20 - 32 mmol/L   Calcium 9.6 8.6 - 10.4 mg/dL   Total Protein 6.7 6.1 - 8.1 g/dL   Albumin 4.1 3.6 - 5.1 g/dL   Globulin 2.6 1.9 - 3.7 g/dL (calc)   AG Ratio 1.6 1.0 - 2.5 (calc)   Total Bilirubin 0.6 0.2 - 1.2 mg/dL   Alkaline phosphatase (APISO) 54 37 - 153 U/L   AST 11 10 - 35 U/L   ALT 14 6 - 29 U/L  Lipid Panel w/reflex Direct LDL  Result Value Ref Range   Cholesterol 168 <200 mg/dL   HDL 52 > OR = 50 mg/dL   Triglycerides 81 <150 mg/dL   LDL Cholesterol (Calc) 99 mg/dL (calc)   Total CHOL/HDL Ratio 3.2 <5.0 (calc)   Non-HDL Cholesterol (Calc) 116 <130 mg/dL (calc)  POCT glycosylated hemoglobin (Hb A1C)  Result Value Ref Range   Hemoglobin A1C 7.2 (A) 4.0 - 5.6 %   HbA1c POC (<> result, manual entry)     HbA1c, POC (prediabetic range)     HbA1c, POC (controlled diabetic range)     a1c not to goal.  Pt refuses statin despite risk.  Decrease januvia for renal adjustment to 50mg  daily.  On ARB. BP to goal.  UTD foot exam.  UTD eye exam.  Flu and pneumonia vaccine UTD.   Clonazepam refilled for as needed use. Encouraged not to use daily.   Treated chemical reactive airway with medrol dose pack. Albuterol as needed. Follow up as needed.   Fasting labs ordered.

## 2019-09-24 LAB — COMPLETE METABOLIC PANEL WITH GFR
AG Ratio: 1.6 (calc) (ref 1.0–2.5)
ALT: 14 U/L (ref 6–29)
AST: 11 U/L (ref 10–35)
Albumin: 4.1 g/dL (ref 3.6–5.1)
Alkaline phosphatase (APISO): 54 U/L (ref 37–153)
BUN/Creatinine Ratio: 14 (calc) (ref 6–22)
BUN: 21 mg/dL (ref 7–25)
CO2: 30 mmol/L (ref 20–32)
Calcium: 9.6 mg/dL (ref 8.6–10.4)
Chloride: 103 mmol/L (ref 98–110)
Creat: 1.52 mg/dL — ABNORMAL HIGH (ref 0.50–1.05)
GFR, Est African American: 43 mL/min/{1.73_m2} — ABNORMAL LOW (ref >=60)
GFR, Est Non African American: 37 mL/min/{1.73_m2} — ABNORMAL LOW (ref >=60)
Globulin: 2.6 g/dL (calc) (ref 1.9–3.7)
Glucose, Bld: 169 mg/dL — ABNORMAL HIGH (ref 65–99)
Potassium: 3.8 mmol/L (ref 3.5–5.3)
Sodium: 139 mmol/L (ref 135–146)
Total Bilirubin: 0.6 mg/dL (ref 0.2–1.2)
Total Protein: 6.7 g/dL (ref 6.1–8.1)

## 2019-09-24 LAB — LIPID PANEL W/REFLEX DIRECT LDL
Cholesterol: 168 mg/dL (ref <200)
HDL: 52 mg/dL (ref >=50)
LDL Cholesterol (Calc): 99 mg/dL (calc)
Non-HDL Cholesterol (Calc): 116 mg/dL (calc) (ref <130)
Total CHOL/HDL Ratio: 3.2 (calc) (ref <5.0)
Triglycerides: 81 mg/dL (ref <150)

## 2019-09-26 DIAGNOSIS — Z1231 Encounter for screening mammogram for malignant neoplasm of breast: Secondary | ICD-10-CM | POA: Diagnosis not present

## 2019-09-26 LAB — HM MAMMOGRAPHY

## 2019-09-26 NOTE — Progress Notes (Signed)
Loris,   Kidney function just slightly better than 1 year ago.  LDL not to goal of under 70. Even starting a statin 3 times a week could get you to goal. At goal decreases cardiovascular risk. Thoughts?

## 2019-09-28 ENCOUNTER — Encounter: Payer: Self-pay | Admitting: Physician Assistant

## 2019-10-02 ENCOUNTER — Telehealth: Payer: Self-pay | Admitting: Physician Assistant

## 2019-10-03 NOTE — Telephone Encounter (Signed)
Patient made aware. She just picked up medication and will just split pills in half.

## 2019-10-12 ENCOUNTER — Encounter: Payer: Self-pay | Admitting: Physician Assistant

## 2019-10-14 ENCOUNTER — Encounter: Payer: Self-pay | Admitting: Nurse Practitioner

## 2019-10-14 ENCOUNTER — Telehealth (INDEPENDENT_AMBULATORY_CARE_PROVIDER_SITE_OTHER): Payer: BLUE CROSS/BLUE SHIELD | Admitting: Nurse Practitioner

## 2019-10-14 DIAGNOSIS — J069 Acute upper respiratory infection, unspecified: Secondary | ICD-10-CM | POA: Diagnosis not present

## 2019-10-14 DIAGNOSIS — R059 Cough, unspecified: Secondary | ICD-10-CM

## 2019-10-14 DIAGNOSIS — R05 Cough: Secondary | ICD-10-CM

## 2019-10-14 MED ORDER — AZITHROMYCIN 250 MG PO TABS: ORAL_TABLET | ORAL | 0 refills | Status: DC

## 2019-10-14 MED ORDER — ALBUTEROL SULFATE HFA 108 (90 BASE) MCG/ACT IN AERS: 1.0000 | INHALATION_SPRAY | RESPIRATORY_TRACT | 1 refills | Status: DC | PRN

## 2019-10-14 MED ORDER — BENZONATATE 200 MG PO CAPS: 200.0000 mg | ORAL_CAPSULE | Freq: Two times a day (BID) | ORAL | 0 refills | Status: DC | PRN

## 2019-10-14 NOTE — Progress Notes (Signed)
Virtual Visit via MyChart Note  I connected with  Renee Wade on 10/14/19 at  1:10 PM EDT by the video enabled telemedicine application, MyChart, and verified that I am speaking with the correct person using two identifiers.   I introduced myself as a Designer, jewellery with the practice. We discussed the limitations of evaluation and management by telemedicine and the availability of in person appointments. The patient expressed understanding and agreed to proceed.  The patient is: at work I am: in the office  Subjective:    CC: Upper Respiratory Infection and Cough   HPI: Renee Wade is a 59 y.o. y/o female presenting via [facility] today for cough and upper respiratory infection that has been going on since before 09/23/19. She did receive a steroid injection in the office on 04/16 that helped some, but her cough has increased and is now productive with green mucous. 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   Endorses increased cough, green mucous production, congestion, rhinorrhea, and generally feeling bad.  Denies fever, difficulty breathing, nausea, vomiting, or diarrhea.   Past medical history, Surgical history, Family history not pertinant except as noted below, Social history, Allergies, and medications have been entered into the medical record, reviewed, and corrections made.   Review of Systems:  See HPI for pertinent positives and negatives.   Objective:    General: Speaking clearly in complete sentences without any shortness of breath.  Alert and oriented x3.  Normal judgment. No apparent acute distress.   Impression and Recommendations:    1. Upper respiratory tract infection, unspecified type Symptoms and presentation consistent with upper respiratory infection. Given the length of time she has been experiencing symptoms and the severity of her symptoms, it is likely a bacterial component now exists and antibiotic  therapy is warranted. Prescription for zpack sent to pharmacy. Patient instructed to contact the office if her symptoms are not improved by next week.  - azithromycin (ZITHROMAX) 250 MG tablet; Take 2 tablets now and then one tablet for 4 days.  Dispense: 6 tablet; Refill: 0  2. Cough Productive cough, ongoing for 3 weeks with upper respiratory symptoms. Prescription for Z-pack sent to pharmacy to help with infection. Will also provide albuterol inhaler to help with breathing and tessalon pearls for cough. Patient instructed to contact the office if her symptoms are not improved by next week. - benzonatate (TESSALON) 200 MG capsule; Take 1 capsule (200 mg total) by mouth 2 (two) times daily as needed for cough.  Dispense: 20 capsule; Refill: 0 - albuterol (VENTOLIN HFA) 108 (90 Base) MCG/ACT inhaler; Inhale 1-2 puffs into the lungs every 4 (four) hours as needed for wheezing or shortness of breath.  Dispense: 8 g; Refill: 1     I discussed the assessment and treatment plan with the patient. The patient was provided an opportunity to ask questions and all were answered. The patient agreed with the plan and demonstrated an understanding of the instructions.   The patient was advised to call back or seek an in-person evaluation if the symptoms worsen or if the condition fails to improve as anticipated.  I provided 8 minutes of non-face-to-face interaction with this [place] visit.   Orma Render, NP

## 2019-10-14 NOTE — Progress Notes (Signed)
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

## 2019-10-14 NOTE — Patient Instructions (Signed)

## 2019-12-23 ENCOUNTER — Ambulatory Visit: Payer: BLUE CROSS/BLUE SHIELD | Admitting: Physician Assistant

## 2020-02-23 ENCOUNTER — Encounter: Payer: Self-pay | Admitting: Physician Assistant

## 2020-02-23 DIAGNOSIS — F411 Generalized anxiety disorder: Secondary | ICD-10-CM

## 2020-02-24 MED ORDER — CLONAZEPAM 0.5 MG PO TABS: 0.5000 mg | ORAL_TABLET | Freq: Two times a day (BID) | ORAL | 5 refills | Status: DC | PRN

## 2020-02-29 MED ORDER — CLONAZEPAM 0.5 MG PO TABS: 0.5000 mg | ORAL_TABLET | Freq: Two times a day (BID) | ORAL | 1 refills | Status: DC | PRN

## 2020-02-29 NOTE — Telephone Encounter (Signed)
Renee Wade - 90 day RX pended with note that this replaces the other RX. Please sign.   Apolonio Schneiders - please review previous message and address, I don't believe the information given to patient is correct.

## 2020-02-29 NOTE — Telephone Encounter (Signed)
I am ok with 90 if insurance will pay for it.

## 2020-02-29 NOTE — Addendum Note (Signed)
Addended byAnnamaria Helling on: 02/29/2020 07:22 AM   Modules accepted: Orders

## 2020-04-03 ENCOUNTER — Other Ambulatory Visit: Payer: Self-pay

## 2020-04-03 ENCOUNTER — Encounter: Payer: Self-pay | Admitting: Physician Assistant

## 2020-04-03 ENCOUNTER — Ambulatory Visit: Payer: BLUE CROSS/BLUE SHIELD | Admitting: Physician Assistant

## 2020-04-03 VITALS — BP 126/54 | HR 80 | Ht 69.0 in | Wt 264.0 lb

## 2020-04-03 DIAGNOSIS — E785 Hyperlipidemia, unspecified: Secondary | ICD-10-CM | POA: Diagnosis not present

## 2020-04-03 DIAGNOSIS — N183 Chronic kidney disease, stage 3 unspecified: Secondary | ICD-10-CM | POA: Diagnosis not present

## 2020-04-03 DIAGNOSIS — I10 Essential (primary) hypertension: Secondary | ICD-10-CM | POA: Diagnosis not present

## 2020-04-03 DIAGNOSIS — E1122 Type 2 diabetes mellitus with diabetic chronic kidney disease: Secondary | ICD-10-CM | POA: Diagnosis not present

## 2020-04-03 DIAGNOSIS — Z6838 Body mass index (BMI) 38.0-38.9, adult: Secondary | ICD-10-CM

## 2020-04-03 LAB — POCT GLYCOSYLATED HEMOGLOBIN (HGB A1C): Hemoglobin A1C: 7.7 % — AB (ref 4.0–5.6)

## 2020-04-03 NOTE — Progress Notes (Signed)
Subjective:    Patient ID: Renee Wade, female    DOB: 1960/08/19, 59 y.o.   MRN: [id number]  HPI  [name] a 59 yo obese female with T2DM, HTN, CKD, GERD, HLD who presents to the clinic for 3 month follow up.   She is not checking her sugars. She denies any hypoglycemic symptoms. She did not start [place]. She does not want to start medication. She lost 15lbs over past 3 months and made a lot of diet changes. Last A1C was 7.2. she is walking more. No open sores or wounds.   .. Active Ambulatory Problems    Diagnosis Date Noted  . Esophageal stenosis 12/18/2015  . Gastroesophageal reflux disease with esophagitis 12/18/2015  . Essential hypertension, benign 12/18/2015  . History of DVT (deep vein thrombosis) 12/18/2015  . Anxiety state 12/18/2015  . CKD (chronic kidney disease) stage 3, GFR 30-59 ml/min (HCC) 12/18/2015  . Ganglion cyst of wrist, left 01/08/2016  . Adenomatous polyp 02/13/2016  . It band syndrome, left 11/12/2016  . Trochanteric bursitis of left hip 11/12/2016  . Type II diabetes mellitus with stage 3 chronic kidney disease ([place]) 11/13/2016  . Dyslipidemia, goal LDL below 70 11/14/2016  . Lower extremity edema 03/17/2018  . Statin declined 04/05/2018  . Acute idiopathic gout of left foot 05/28/2018  . Menopausal symptoms 02/28/2019   Resolved Ambulatory Problems    Diagnosis Date Noted  . Class 2 severe obesity due to excess calories with serious comorbidity and body mass index (BMI) of 39.0 to 39.9 in adult ([place]) 12/19/2015  . BMI 39.0-39.9,adult 02/01/2017   Past Medical History:  Diagnosis Date  . Depression   . H/O DVT   . Hypertension   . Obesity      Review of Systems  All other systems reviewed and are negative.      Objective:   Physical Exam Vitals reviewed.  Constitutional:      Appearance: Normal appearance.  HENT:     Head: Normocephalic.  Cardiovascular:     Rate and Rhythm: Normal rate and regular rhythm.  Pulmonary:      Effort: Pulmonary effort is normal.     Breath sounds: Normal breath sounds.  Neurological:     General: No focal deficit present.     Mental Status: She is alert and oriented to person, place, and time.  Psychiatric:        Mood and Affect: Mood normal.           Assessment & Plan:  Marland [name] KitchenAngelena was seen today for diabetes and anxiety.  Diagnoses and all orders for this visit:  Type 2 diabetes mellitus with stage 3 chronic kidney disease, without long-term current use of insulin, unspecified whether stage 3a or 3b CKD (HCC) -     POCT glycosylated hemoglobin (Hb A1C) -     COMPLETE METABOLIC PANEL WITH GFR  Essential hypertension, benign -     COMPLETE METABOLIC PANEL WITH GFR  Class 2 severe obesity due to excess calories with serious comorbidity and body mass index (BMI) of 38.0 to 38.9 in adult (HCC)  Dyslipidemia, goal LDL below 70   .Marland Kitchen Results for orders placed or performed in visit on 04/03/20  POCT glycosylated hemoglobin (Hb A1C)  Result Value Ref Range   Hemoglobin A1C 7.7 (A) 4.0 - 5.6 %   HbA1c POC (<> result, manual entry)     HbA1c, POC (prediabetic range)     HbA1c, POC (controlled diabetic range)  A1C worsened.  Continue DM diet.  Encouraged 150 minutes of exercise weekly.  Pt needs to start medication.  GFR was 37 at last check. Will recheck today and decide what the next treatment should be. I would like to start GLT-2 but GFR needs to be above 30.  Pt has not tolerated metformin or GLP-1.  Not on statin discussed important risk prevention this could be. Pt declines today.  On ARB. BP to goal.  Foot exam UTD.  Need eye exam.  Pt declined flu.  Pneumonia UtD.  Follow up in 3 months.

## 2020-04-04 ENCOUNTER — Other Ambulatory Visit: Payer: Self-pay | Admitting: Physician Assistant

## 2020-04-04 LAB — COMPLETE METABOLIC PANEL WITH GFR
AG Ratio: 1.6 (calc) (ref 1.0–2.5)
ALT: 17 U/L (ref 6–29)
AST: 16 U/L (ref 10–35)
Albumin: 4.3 g/dL (ref 3.6–5.1)
Alkaline phosphatase (APISO): 53 U/L (ref 37–153)
BUN/Creatinine Ratio: 16 (calc) (ref 6–22)
BUN: 24 mg/dL (ref 7–25)
CO2: 28 mmol/L (ref 20–32)
Calcium: 10 mg/dL (ref 8.6–10.4)
Chloride: 105 mmol/L (ref 98–110)
Creat: 1.49 mg/dL — ABNORMAL HIGH (ref 0.50–1.05)
GFR, Est African American: 44 mL/min/{1.73_m2} — ABNORMAL LOW (ref >=60)
GFR, Est Non African American: 38 mL/min/{1.73_m2} — ABNORMAL LOW (ref >=60)
Globulin: 2.7 g/dL (calc) (ref 1.9–3.7)
Glucose, Bld: 120 mg/dL — ABNORMAL HIGH (ref 65–99)
Potassium: 4.5 mmol/L (ref 3.5–5.3)
Sodium: 141 mmol/L (ref 135–146)
Total Bilirubin: 0.9 mg/dL (ref 0.2–1.2)
Total Protein: 7 g/dL (ref 6.1–8.1)

## 2020-04-04 MED ORDER — DAPAGLIFLOZIN PROPANEDIOL 10 MG PO TABS: 10.0000 mg | ORAL_TABLET | Freq: Every day | ORAL | 2 refills | Status: DC

## 2020-04-04 NOTE — Progress Notes (Signed)
Renee Wade,   GFR 38 just a tad better than 6 months ago but can start farxiga 10mg  daily in the mornings. Recheck in 3 months.

## 2020-04-12 LAB — HM DIABETES EYE EXAM

## 2020-05-07 ENCOUNTER — Other Ambulatory Visit: Payer: Self-pay | Admitting: Neurology

## 2020-05-07 DIAGNOSIS — K2100 Gastro-esophageal reflux disease with esophagitis, without bleeding: Secondary | ICD-10-CM

## 2020-05-07 DIAGNOSIS — I10 Essential (primary) hypertension: Secondary | ICD-10-CM

## 2020-05-07 MED ORDER — OLMESARTAN MEDOXOMIL-HCTZ 20-12.5 MG PO TABS: 1.0000 | ORAL_TABLET | Freq: Every day | ORAL | 3 refills | Status: DC

## 2020-05-07 MED ORDER — PANTOPRAZOLE SODIUM 40 MG PO TBEC: 40.0000 mg | DELAYED_RELEASE_TABLET | Freq: Every day | ORAL | 3 refills | Status: DC

## 2020-06-12 ENCOUNTER — Encounter: Payer: Self-pay | Admitting: Physician Assistant

## 2020-06-29 ENCOUNTER — Encounter: Payer: Self-pay | Admitting: Physician Assistant

## 2020-07-02 ENCOUNTER — Encounter: Payer: Self-pay | Admitting: Physician Assistant

## 2020-07-04 ENCOUNTER — Ambulatory Visit: Payer: BLUE CROSS/BLUE SHIELD | Admitting: Physician Assistant

## 2020-07-04 MED ORDER — BENZONATATE 200 MG PO CAPS: 200.0000 mg | ORAL_CAPSULE | Freq: Two times a day (BID) | ORAL | 0 refills | Status: DC | PRN

## 2020-09-11 ENCOUNTER — Telehealth (INDEPENDENT_AMBULATORY_CARE_PROVIDER_SITE_OTHER): Payer: BLUE CROSS/BLUE SHIELD | Admitting: Physician Assistant

## 2020-09-11 ENCOUNTER — Encounter: Payer: Self-pay | Admitting: Physician Assistant

## 2020-09-11 VITALS — Ht 69.0 in | Wt 262.0 lb

## 2020-09-11 DIAGNOSIS — J0140 Acute pansinusitis, unspecified: Secondary | ICD-10-CM | POA: Diagnosis not present

## 2020-09-11 MED ORDER — AZITHROMYCIN 250 MG PO TABS: ORAL_TABLET | ORAL | 0 refills | Status: DC

## 2020-09-11 NOTE — Progress Notes (Deleted)
Allergies acting up Coughing up yellow phlegm Not taking any allergy medication, just cough drops

## 2020-09-11 NOTE — Progress Notes (Signed)
Patient ID: Renee Wade, female   DOB: Oct 09, 1960, 60 y.o.   MRN: [id number] .Marland KitchenVirtual Visit via Telephone Note  I connected with Renee Wade on 09/11/2020 at  4:20 PM EDT by telephone and verified that I am speaking with the correct person using two identifiers.  Location: Patient: home Provider: clinic  .Marland KitchenParticipating in visit:  Patient: Renee Wade Provider: Iran Planas PA-C   I discussed the limitations, risks, security and privacy concerns of performing an evaluation and management service by telephone and the availability of in person appointments. I also discussed with the patient that there may be a patient responsible charge related to this service. The patient expressed understanding and agreed to proceed.   History of Present Illness: [name] a 60 yo female with hx of sinusitis who presents to the clinic with 1 week of symptoms. She is blowing and cough up some yellow phelgym. She is nearing the end of tax seasons and has been working hard and not sleeping and eating right. She admits to having some allergies and not taking any medication. No fever, chills, SOB, headache, nausea, vomiting.    .. Active Ambulatory Problems    Diagnosis Date Noted  . Esophageal stenosis 12/18/2015  . Gastroesophageal reflux disease with esophagitis 12/18/2015  . Essential hypertension, benign 12/18/2015  . History of DVT (deep vein thrombosis) 12/18/2015  . Anxiety state 12/18/2015  . CKD (chronic kidney disease) stage 3, GFR 30-59 ml/min (HCC) 12/18/2015  . Ganglion cyst of wrist, left 01/08/2016  . Adenomatous polyp 02/13/2016  . It band syndrome, left 11/12/2016  . Trochanteric bursitis of left hip 11/12/2016  . Type II diabetes mellitus with stage 3 chronic kidney disease ([place]) 11/13/2016  . Dyslipidemia, goal LDL below 70 11/14/2016  . Lower extremity edema 03/17/2018  . Statin declined 04/05/2018  . Acute idiopathic gout of left foot 05/28/2018  . Menopausal symptoms 02/28/2019    Resolved Ambulatory Problems    Diagnosis Date Noted  . Class 2 severe obesity due to excess calories with serious comorbidity and body mass index (BMI) of 39.0 to 39.9 in adult ([place]) 12/19/2015  . BMI 39.0-39.9,adult 02/01/2017   Past Medical History:  Diagnosis Date  . Depression   . H/O DVT   . Hypertension   . Obesity    Reviewed med, allergy, problem list.   Observations/Objective: No acute distress Normal breathing Dry cough  .Marland Kitchen Today's Vitals   09/11/20 1548  Weight: 262 lb (118.8 kg)  Height: 5\' 9"  (1.753 m)   Body mass index is 38.69 kg/m.    Assessment and Plan: Marland [name] KitchenMelannie was seen today for cough.  Diagnoses and all orders for this visit:  Acute non-recurrent pansinusitis -     azithromycin (ZITHROMAX Z-PAK) 250 MG tablet; Take 2 tablets (500 mg) on  Day 1,  followed by 1 tablet (250 mg) once daily on Days 2 through 5.   1 week of symptoms. No other sick contacts. Hx of sinusitis. Start zpak. Pt hates nasal sprays. Symptomatic care discussed. Start allergy anti-histamine daily.   Follow Up Instructions:    I discussed the assessment and treatment plan with the patient. The patient was provided an opportunity to ask questions and all were answered. The patient agreed with the plan and demonstrated an understanding of the instructions.   The patient was advised to call back or seek an in-person evaluation if the symptoms worsen or if the condition fails to improve as anticipated.  I provided 10 minutes of  non-face-to-face time during this encounter.   Iran Planas, PA-C

## 2020-09-12 ENCOUNTER — Encounter: Payer: Self-pay | Admitting: Physician Assistant

## 2020-10-02 ENCOUNTER — Ambulatory Visit: Payer: BLUE CROSS/BLUE SHIELD | Admitting: Physician Assistant

## 2020-10-05 ENCOUNTER — Encounter: Payer: Self-pay | Admitting: Physician Assistant

## 2020-10-05 NOTE — Telephone Encounter (Signed)
Patients have been scheduled for 10/12/20 with PCP. AM

## 2020-10-09 ENCOUNTER — Encounter: Payer: Self-pay | Admitting: Physician Assistant

## 2020-10-12 ENCOUNTER — Ambulatory Visit: Payer: BLUE CROSS/BLUE SHIELD | Admitting: Physician Assistant

## 2020-10-16 ENCOUNTER — Ambulatory Visit: Payer: Self-pay | Admitting: Pharmacist

## 2020-10-16 ENCOUNTER — Encounter: Payer: Self-pay | Admitting: Physician Assistant

## 2020-10-16 ENCOUNTER — Ambulatory Visit: Payer: BLUE CROSS/BLUE SHIELD | Admitting: Physician Assistant

## 2020-10-16 ENCOUNTER — Other Ambulatory Visit: Payer: Self-pay

## 2020-10-16 VITALS — BP 133/72 | HR 80 | Ht 69.0 in | Wt 269.0 lb

## 2020-10-16 DIAGNOSIS — Z79899 Other long term (current) drug therapy: Secondary | ICD-10-CM

## 2020-10-16 DIAGNOSIS — K2100 Gastro-esophageal reflux disease with esophagitis, without bleeding: Secondary | ICD-10-CM

## 2020-10-16 DIAGNOSIS — R350 Frequency of micturition: Secondary | ICD-10-CM

## 2020-10-16 DIAGNOSIS — R1013 Epigastric pain: Secondary | ICD-10-CM

## 2020-10-16 DIAGNOSIS — I10 Essential (primary) hypertension: Secondary | ICD-10-CM | POA: Diagnosis not present

## 2020-10-16 DIAGNOSIS — F411 Generalized anxiety disorder: Secondary | ICD-10-CM

## 2020-10-16 DIAGNOSIS — N760 Acute vaginitis: Secondary | ICD-10-CM

## 2020-10-16 DIAGNOSIS — E1122 Type 2 diabetes mellitus with diabetic chronic kidney disease: Secondary | ICD-10-CM | POA: Diagnosis not present

## 2020-10-16 DIAGNOSIS — N183 Chronic kidney disease, stage 3 unspecified: Secondary | ICD-10-CM | POA: Diagnosis not present

## 2020-10-16 DIAGNOSIS — Z1231 Encounter for screening mammogram for malignant neoplasm of breast: Secondary | ICD-10-CM

## 2020-10-16 DIAGNOSIS — N898 Other specified noninflammatory disorders of vagina: Secondary | ICD-10-CM

## 2020-10-16 DIAGNOSIS — B9689 Other specified bacterial agents as the cause of diseases classified elsewhere: Secondary | ICD-10-CM

## 2020-10-16 DIAGNOSIS — E785 Hyperlipidemia, unspecified: Secondary | ICD-10-CM

## 2020-10-16 DIAGNOSIS — Z1329 Encounter for screening for other suspected endocrine disorder: Secondary | ICD-10-CM

## 2020-10-16 LAB — POCT GLYCOSYLATED HEMOGLOBIN (HGB A1C): Hemoglobin A1C: 10.8 % — AB (ref 4.0–5.6)

## 2020-10-16 LAB — WET PREP FOR TRICH, YEAST, CLUE
MICRO NUMBER:: 11871291
Specimen Quality: ADEQUATE

## 2020-10-16 LAB — POCT URINALYSIS DIP (CLINITEK)
Bilirubin, UA: NEGATIVE
Glucose, UA: 100 mg/dL — AB
Ketones, POC UA: NEGATIVE mg/dL
Leukocytes, UA: NEGATIVE
Nitrite, UA: NEGATIVE
POC PROTEIN,UA: 30 — AB
Spec Grav, UA: >=1.03 — AB (ref 1.010–1.025)
Urobilinogen, UA: 0.2 E.U./dL
pH, UA: 6 (ref 5.0–8.0)

## 2020-10-16 MED ORDER — ESCITALOPRAM OXALATE 5 MG PO TABS: 5.0000 mg | ORAL_TABLET | Freq: Every day | ORAL | 0 refills | Status: DC

## 2020-10-16 MED ORDER — ROSUVASTATIN CALCIUM 5 MG PO TABS: 5.0000 mg | ORAL_TABLET | Freq: Every day | ORAL | 3 refills | Status: DC

## 2020-10-16 MED ORDER — SYNJARDY XR 10-1000 MG PO TB24: 1.0000 | ORAL_TABLET | Freq: Every day | ORAL | 2 refills | Status: DC

## 2020-10-16 MED ORDER — METRONIDAZOLE 500 MG PO TABS: 500.0000 mg | ORAL_TABLET | Freq: Two times a day (BID) | ORAL | 0 refills | Status: AC

## 2020-10-16 NOTE — Progress Notes (Signed)
Subjective:    Patient ID: Renee Wade, female    DOB: 1960/08/02, 60 y.o.   MRN: [id number]  HPI  [name] a 60 yo obese female with T2DM, HTN, and  Anxiety who presents to the clinic for follow up.   Last a1c was 7.7 in October 2021. She has not been eating or drinking healthy. She denies any   She has had a lot of stress taking care of her mother and father. She finds herself very anxious. She is only on klonapin. She takes most days. No SI/HC.   She is having a lot of GI symptoms. She is having epigastric pain, nausea, reflux despite being on protonix and pepcid. No melena or hematochezia. Not had EGD done in years.   She is having lots of urinary frequency and itching. No dysuria.   .. Active Ambulatory Problems    Diagnosis Date Noted  . Esophageal stenosis 12/18/2015  . Gastroesophageal reflux disease with esophagitis 12/18/2015  . Essential hypertension, benign 12/18/2015  . History of DVT (deep vein thrombosis) 12/18/2015  . Anxiety state 12/18/2015  . CKD (chronic kidney disease) stage 3, GFR 30-59 ml/min (HCC) 12/18/2015  . Ganglion cyst of wrist, left 01/08/2016  . Adenomatous polyp 02/13/2016  . It band syndrome, left 11/12/2016  . Trochanteric bursitis of left hip 11/12/2016  . Type II diabetes mellitus with stage 3 chronic kidney disease ([place]) 11/13/2016  . Dyslipidemia, goal LDL below 70 11/14/2016  . Lower extremity edema 03/17/2018  . Statin declined 04/05/2018  . Acute idiopathic gout of left foot 05/28/2018  . Menopausal symptoms 02/28/2019  . Bacterial vaginosis 10/16/2020  . Epigastric pain 10/17/2020   Resolved Ambulatory Problems    Diagnosis Date Noted  . Class 2 severe obesity due to excess calories with serious comorbidity and body mass index (BMI) of 39.0 to 39.9 in adult ([place]) 12/19/2015  . BMI 39.0-39.9,adult 02/01/2017   Past Medical History:  Diagnosis Date  . Depression   . H/O DVT   . Hypertension   . Obesity       Review of  Systems    see HPI. Objective:   Physical Exam Vitals reviewed.  Constitutional:      Appearance: Normal appearance. She is obese.  HENT:     Head: Normocephalic.  Cardiovascular:     Rate and Rhythm: Normal rate and regular rhythm.     Pulses: Normal pulses.     Heart sounds: Normal heart sounds.  Pulmonary:     Effort: Pulmonary effort is normal.     Breath sounds: Normal breath sounds.  Lymphadenopathy:     Cervical: No cervical adenopathy.  Neurological:     General: No focal deficit present.     Mental Status: She is alert and oriented to person, place, and time.  Psychiatric:        Mood and Affect: Mood normal.       .. Lab Results  Component Value Date   HGBA1C 10.8 (A) 10/16/2020   .Marland Kitchen Results for orders placed or performed in visit on 10/16/20  WET PREP FOR [place], YEAST, CLUE   Specimen: GYN  Result Value Ref Range   MICRO NUMBER: [id number]    Specimen Quality Adequate    SOURCE: NOT GIVEN    Status FINAL    RESULT      No Trichomonas vaginalis seen. Clue cells seen No yeast seen Epithelial Cells Present  CBC with Differential/Platelet  Result Value Ref Range  WBC 6.6 3.8 - 10.8 Thousand/uL   RBC 4.28 3.80 - 5.10 Million/uL   Hemoglobin 11.8 11.7 - 15.5 g/dL   HCT 37.0 35.0 - 45.0 %   MCV 86.4 80.0 - 100.0 fL   MCH 27.6 27.0 - 33.0 pg   MCHC 31.9 (L) 32.0 - 36.0 g/dL   RDW 14.1 11.0 - 15.0 %   Platelets 347 140 - 400 Thousand/uL   MPV 10.1 7.5 - 12.5 fL   Neutro Abs 3,643 1,500 - 7,800 cells/uL   Lymphs Abs 2,409 850 - 3,900 cells/uL   Absolute Monocytes 350 200 - 950 cells/uL   Eosinophils Absolute 158 15 - 500 cells/uL   Basophils Absolute 40 0 - 200 cells/uL   Neutrophils Relative % 55.2 %   Total Lymphocyte 36.5 %   Monocytes Relative 5.3 %   Eosinophils Relative 2.4 %   Basophils Relative 0.6 %  COMPLETE METABOLIC PANEL WITH GFR  Result Value Ref Range   Glucose, Bld 224 (H) 65 - 99 mg/dL   BUN 19 7 - 25 mg/dL   Creat 1.44 (H) 0.50  - 1.05 mg/dL   GFR, Est Non African American 40 (L) > OR = 60 mL/min/1.65m2   GFR, Est African American 46 (L) > OR = 60 mL/min/1.63m2   BUN/Creatinine Ratio 13 6 - 22 (calc)   Sodium 137 135 - 146 mmol/L   Potassium 4.3 3.5 - 5.3 mmol/L   Chloride 100 98 - 110 mmol/L   CO2 30 20 - 32 mmol/L   Calcium 10.0 8.6 - 10.4 mg/dL   Total Protein 7.0 6.1 - 8.1 g/dL   Albumin 4.3 3.6 - 5.1 g/dL   Globulin 2.7 1.9 - 3.7 g/dL (calc)   AG Ratio 1.6 1.0 - 2.5 (calc)   Total Bilirubin 0.9 0.2 - 1.2 mg/dL   Alkaline phosphatase (APISO) 68 37 - 153 U/L   AST 23 10 - 35 U/L   ALT 27 6 - 29 U/L  Lipid Panel w/reflex Direct LDL  Result Value Ref Range   Cholesterol 184 <200 mg/dL   HDL 59 > OR = 50 mg/dL   Triglycerides 136 <150 mg/dL   LDL Cholesterol (Calc) 102 (H) mg/dL (calc)   Total CHOL/HDL Ratio 3.1 <5.0 (calc)   Non-HDL Cholesterol (Calc) 125 <130 mg/dL (calc)  TSH  Result Value Ref Range   TSH 2.58 0.40 - 4.50 mIU/L  POCT glycosylated hemoglobin (Hb A1C)  Result Value Ref Range   Hemoglobin A1C 10.8 (A) 4.0 - 5.6 %   HbA1c POC (<> result, manual entry)     HbA1c, POC (prediabetic range)     HbA1c, POC (controlled diabetic range)    POCT URINALYSIS DIP (CLINITEK)  Result Value Ref Range   Color, UA yellow yellow   Clarity, UA clear clear   Glucose, UA =100 (A) negative mg/dL   Bilirubin, UA negative negative   Ketones, POC UA negative negative mg/dL   Spec Grav, UA >=1.030 (A) 1.010 - 1.025   Blood, UA trace-lysed (A) negative   pH, UA 6.0 5.0 - 8.0   POC PROTEIN,UA =30 (A) negative, trace   Urobilinogen, UA 0.2 0.2 or 1.0 E.U./dL   Nitrite, UA Negative Negative   Leukocytes, UA Negative Negative       Assessment & Plan:  Marland [name] KitchenMyriam was seen today for diabetes.  Diagnoses and all orders for this visit:  Type 2 diabetes mellitus with stage 3 chronic kidney disease, without long-term current use of insulin,  unspecified whether stage 3a or 3b CKD (HCC) -     COMPLETE  METABOLIC PANEL WITH GFR -     POCT glycosylated hemoglobin (Hb A1C) -     Empagliflozin-metFORMIN HCl ER (SYNJARDY XR) 03-999 MG TB24; Take 1 tablet by mouth daily.  Essential hypertension, benign -     COMPLETE METABOLIC PANEL WITH GFR  Dyslipidemia, goal LDL below 70 -     Lipid Panel w/reflex Direct LDL -     rosuvastatin (CRESTOR) 5 MG tablet; Take 1 tablet (5 mg total) by mouth daily.  Medication management -     CBC with Differential/Platelet -     COMPLETE METABOLIC PANEL WITH GFR -     Lipid Panel w/reflex Direct LDL -     TSH -     POCT glycosylated hemoglobin (Hb A1C)  Thyroid disorder screen -     TSH  Encounter for screening mammogram for malignant neoplasm of breast  Urinary frequency -     POCT URINALYSIS DIP (CLINITEK) -     WET PREP FOR TRICH, YEAST, CLUE  Vaginal itching -     POCT URINALYSIS DIP (CLINITEK) -     WET PREP FOR TRICH, YEAST, CLUE  Bacterial vaginosis -     metroNIDAZOLE (FLAGYL) 500 MG tablet; Take 1 tablet (500 mg total) by mouth 2 (two) times daily for 7 days.  GAD (generalized anxiety disorder) -     escitalopram (LEXAPRO) 5 MG tablet; Take 1 tablet (5 mg total) by mouth daily.  Gastroesophageal reflux disease with esophagitis, unspecified whether hemorrhage -     Ambulatory referral to Gastroenterology  Epigastric pain  Anxiety state -     clonazePAM (KLONOPIN) 0.5 MG tablet; Take 1 tablet (0.5 mg total) by mouth 2 (two) times daily as needed. for anxiety   A1C NOT to goal.  Long discussion with pt about medication and risk assessment.  Pt was sent to discuss libre and medications with pharmacist.  Added synjardy. Discussed side effects.  Added statin. BP very close to goal. On ARB.  Covid/flu/pnuemonia UTD.   PHQ/GAD not to goal.  Added lexapro. Discussed side effects.  Follow up in 2 months.  Use xanax sparingly. Discussed risk of continued use.   UA normal. Added culture.  Wet prep ordered.  Will treat accordingly.    Worsening GERD. On prontonix and pepcid. Needs EGD.

## 2020-10-16 NOTE — Progress Notes (Signed)
No yeast or trichonmonas. Clue cells were seen. You have bacterial vaginosis. Will send metronidazole to pharmacy.

## 2020-10-16 NOTE — Patient Instructions (Addendum)
Hi Renee Wade!  It was great meeting you today. We set up the glucose sensor and downloaded the app for your phone.   Please scan the sensor to check your sugars at least:  - first thing when you wake up (before food) - 2 hours after a meal (try to do after 2 meals a day, if you can) - anytime you're feeling shaky/dizzy/sweaty  I'll plan to call you in about 2 days after the dust settles with this new sensor, plus the new medications, and see how you're adjusting or what questions you might have. We can also hopefully go over the medications in more detail when you have more time to chat.   Please call the clinic and ask for the pharmacist if you have any questions prior to hearing from me!!   Visit Information   PATIENT GOALS:  Goals Addressed              This Visit's Progress   .  Client will verbalize knowledge of diabetes self-management as evidenced by Hgb A1C <7 or as defined by provider. (pt-stated)        Current Barriers:  . Unable to achieve control of blood glucose   Pharmacist Clinical Goal(s):  Marland Kitchen Over the next 90days, patient will adhere to plan to optimize therapeutic regimen for diabetes as evidenced by report of adherence to recommended medication management changes . contact provider office for questions/concerns as evidenced notation of same in electronic health record through collaboration with PharmD and provider.   Interventions: . 1:1 collaboration with Donella Stade, PA-C regarding development and update of comprehensive plan of care as evidenced by provider attestation and co-signature . Inter-disciplinary care team collaboration (see longitudinal plan of care) . Comprehensive medication review performed; medication list updated in electronic medical record  Diabetes: . Uncontrolled/controlled; current treatment: uncontrolled; previously tried metformin & ozempic . Current glucose readings: patient is unaware of current numbers . Denies  hypoglycemic/hyperglycemic symptoms . Current meal patterns: pt reports not monitoring food intake or intentionality with choices . Educated on continuous glucose monitoring application technique, goals, and duration of sensor use. ,  Hypertension: . Uncontrolled/controlled; current treatment: controlled; benicar HCT   . Denies hypotensive/hypertensive symptoms . Assessed vitals, continue current regimen , and  Hyperlipidemia: . Uncontrolled/controlled; current treatment: uncontrolled;  Marland Kitchen Medications previously tried: N/A . Assessed medication changes implemented at PCP visit today, in agreement with initiation of crestor 5mg . Increase as tolerated,with goal dose of 20mg .  Patient Goals/Self-Care Activities . Over the next 90 days, patient will:  take medications as prescribed and check glucose via scanning CGM sensor, document, and provide at future appointments  Follow Up Plan: The care management team will reach out to the patient again over the next 2 days.          Thanks, Griffin Dakin, [facility]

## 2020-10-16 NOTE — Patient Instructions (Signed)
Start crestor [employer] lexapro

## 2020-10-16 NOTE — Progress Notes (Signed)
Care Management   Pharmacy Note  10/16/2020 Name: Renee Wade MRN: [id number] DOB: 1961-05-07  Subjective: Renee Wade is a 60 y.o. year old female who is a primary care patient of Donella Stade, PA-C. The Care Management team was consulted for assistance with care management and care coordination needs.    Engaged with patient face to face for initial visit in response to provider referral for pharmacy case management and/or care coordination services.   The patient was given information about Care Management services today including:  1. Care Management services includes personalized support from designated clinical staff supervised by the patient's primary care provider, including individualized plan of care and coordination with other care providers. 2. 24/7 contact phone numbers for assistance for urgent and routine care needs. 3. The patient may stop case management services at any time by phone call to the office staff.  Patient agreed to services and consent obtained.  Assessment:  Review of patient status, including review of consultants reports, laboratory and other test data, was performed as part of comprehensive evaluation and provision of chronic care management services.   SDOH (Social Determinants of Health) assessments and interventions performed:    Objective:  Lab Results  Component Value Date   CREATININE 1.49 (H) 04/03/2020   CREATININE 1.52 (H) 09/23/2019   CREATININE 1.61 (H) 03/16/2018    Lab Results  Component Value Date   HGBA1C 10.8 (A) 10/16/2020       Component Value Date/Time   CHOL 168 09/23/2019 0843   TRIG 81 09/23/2019 0843   HDL 52 09/23/2019 0843   CHOLHDL 3.2 09/23/2019 0843   LDLCALC 99 09/23/2019 0843    Clinical ASCVD: No  The 10-year ASCVD risk score Mikey Bussing DC Jr., et al., 2013) is: 7.4%   Values used to calculate the score:     Age: 89 years     Sex: Female     Is Non-Hispanic African American: No     Diabetic:  Yes     Tobacco smoker: No     Systolic Blood Pressure: 474 mmHg     Is BP treated: Yes     HDL Cholesterol: 52 mg/dL     Total Cholesterol: 168 mg/dL     BP Readings from Last 3 Encounters:  10/16/20 133/72  04/03/20 (!) 126/54  09/23/19 119/65    Care Plan  Allergies  Allergen Reactions  . Morphine Nausea And Vomiting  . Oxycodone Nausea And Vomiting  . Belviq [Lorcaserin Hcl] Cough  . Codeine Nausea And Vomiting  . Contrave [Naltrexone-Bupropion Hcl Er]     Irritable.   . Metformin And Related     Diarrhea/GI upset  . Ozempic (0.25 Or 0.5 Mg-Dose) [Semaglutide(0.25 Or 0.5mg -Dos)]     nausea  . Prednisone     Insomnia,mood changes Oral ONLY>    Medications Reviewed Today    Reviewed by Darius Bump, [facility] (Pharmacist) on 10/16/20 at 1443  Med List Status: <None>  Medication Order Taking? Sig Documenting Provider Last Dose Status Informant  aspirin 325 MG tablet [id number] No Take by mouth. [provider] Taking Active            Med Note Renae Fickle, Maryan Char   Tue Dec 18, 2015  9:14 AM) Received from: [place]  clonazePAM (KLONOPIN) 0.5 MG tablet [id number] No Take 1 tablet (0.5 mg total) by mouth 2 (two) times daily as needed. for anxiety Donella Stade, PA-C Taking Active   colchicine 0.6  MG tablet [id number] No Take 2 tablets as needed for gout attack and repeat 1 tablet in one hour if needed for up to 3 days.  Patient not taking: No sig reported   Donella Stade, PA-C Not Taking Active   diclofenac sodium (VOLTAREN) 1 % GEL [id number] No Apply 2 g topically as needed. Donella Stade, PA-C Taking Active   Empagliflozin-metFORMIN HCl ER (SYNJARDY XR) 03-999 MG TB24 [id number]  Take 1 tablet by mouth daily. Breeback, [name] L, PA-C  Active   escitalopram (LEXAPRO) 5 MG tablet [id number]  Take 1 tablet (5 mg total) by mouth daily. Donella Stade, PA-C  Active   famotidine (PEPCID) 20 MG tablet [id number] No Take 1 tablet (20 mg total) by mouth 2 (two)  times daily. Donella Stade, PA-C Taking Active   Misc Natural Products (TART CHERRY ADVANCED PO) [id number] No Take by mouth daily. [provider] Taking Active   olmesartan-hydrochlorothiazide (BENICAR HCT) 20-12.5 MG tablet [id number] No Take 1 tablet by mouth daily. Donella Stade, PA-C Taking Active   Omega 3-6-9 Fatty Acids (OMEGA 3-6-9 COMPLEX PO) [id number] No Take by mouth daily. [provider] Taking Active   pantoprazole (PROTONIX) 40 MG tablet [id number] No Take 1 tablet (40 mg total) by mouth daily. Donella Stade, PA-C Taking Active   rosuvastatin (CRESTOR) 5 MG tablet [id number]  Take 1 tablet (5 mg total) by mouth daily. Donella Stade, PA-C  Active           Patient Active Problem List   Diagnosis Date Noted  . Menopausal symptoms 02/28/2019  . Acute idiopathic gout of left foot 05/28/2018  . Statin declined 04/05/2018  . Lower extremity edema 03/17/2018  . Dyslipidemia, goal LDL below 70 11/14/2016  . Type II diabetes mellitus with stage 3 chronic kidney disease ([place]) 11/13/2016  . It band syndrome, left 11/12/2016  . Trochanteric bursitis of left hip 11/12/2016  . Adenomatous polyp 02/13/2016  . Ganglion cyst of wrist, left 01/08/2016  . Esophageal stenosis 12/18/2015  . Gastroesophageal reflux disease with esophagitis 12/18/2015  . Essential hypertension, benign 12/18/2015  . History of DVT (deep vein thrombosis) 12/18/2015  . Anxiety state 12/18/2015  . CKD (chronic kidney disease) stage 3, GFR 30-59 ml/min (HCC) 12/18/2015    Conditions to be addressed/monitored: DMII  There are no care plans that you recently modified to display for this patient.  Current Barriers:  . Unable to achieve control of blood glucose   Medication Assistance:  None required.  Patient affirms current coverage meets needs.   Pharmacist Clinical Goal(s):  Marland Kitchen Over the next 90days, patient will adhere to plan to optimize therapeutic regimen for diabetes as  evidenced by report of adherence to recommended medication management changes . contact provider office for questions/concerns as evidenced notation of same in electronic health record through collaboration with PharmD and provider.   Interventions: . 1:1 collaboration with Donella Stade, PA-C regarding development and update of comprehensive plan of care as evidenced by provider attestation and co-signature . Inter-disciplinary care team collaboration (see longitudinal plan of care) . Comprehensive medication review performed; medication list updated in electronic medical record  Diabetes: . Uncontrolled; previously tried metformin & ozempic . Current glucose readings: patient is unaware of current numbers . Denies hypoglycemic/hyperglycemic symptoms . Current meal patterns: pt reports not monitoring food intake or intentionality with choices . Educated on continuous glucose monitoring application technique, goals, and duration of CGM sensor use., PCP initiated [place] today,  will monitor outcomes.  Hypertension: . Controlled; benicar HCT   . Denies hypotensive/hypertensive symptoms . Assessed vitals, continue current regimen  Hyperlipidemia: . Uncontrolled; goal LDL <70 . Medications previously tried: N/A . Assessed medication changes implemented at PCP visit today, in agreement with initiation of crestor 5mg . Increase as tolerated,with goal dose of 20mg .  Patient Goals/Self-Care Activities . Over the next 90 days, patient will:  take medications as prescribed and check glucose via scanning CGM sensor, document, and provide at future appointments  Follow Up Plan: The care management team will reach out to the patient again over the next 2 days.     Darius Bump

## 2020-10-17 ENCOUNTER — Encounter: Payer: Self-pay | Admitting: Physician Assistant

## 2020-10-17 DIAGNOSIS — R1013 Epigastric pain: Secondary | ICD-10-CM | POA: Insufficient documentation

## 2020-10-17 LAB — COMPLETE METABOLIC PANEL WITH GFR
AG Ratio: 1.6 (calc) (ref 1.0–2.5)
ALT: 27 U/L (ref 6–29)
AST: 23 U/L (ref 10–35)
Albumin: 4.3 g/dL (ref 3.6–5.1)
Alkaline phosphatase (APISO): 68 U/L (ref 37–153)
BUN/Creatinine Ratio: 13 (calc) (ref 6–22)
BUN: 19 mg/dL (ref 7–25)
CO2: 30 mmol/L (ref 20–32)
Calcium: 10 mg/dL (ref 8.6–10.4)
Chloride: 100 mmol/L (ref 98–110)
Creat: 1.44 mg/dL — ABNORMAL HIGH (ref 0.50–1.05)
GFR, Est African American: 46 mL/min/{1.73_m2} — ABNORMAL LOW (ref >=60)
GFR, Est Non African American: 40 mL/min/{1.73_m2} — ABNORMAL LOW (ref >=60)
Globulin: 2.7 g/dL (calc) (ref 1.9–3.7)
Glucose, Bld: 224 mg/dL — ABNORMAL HIGH (ref 65–99)
Potassium: 4.3 mmol/L (ref 3.5–5.3)
Sodium: 137 mmol/L (ref 135–146)
Total Bilirubin: 0.9 mg/dL (ref 0.2–1.2)
Total Protein: 7 g/dL (ref 6.1–8.1)

## 2020-10-17 LAB — LIPID PANEL W/REFLEX DIRECT LDL
Cholesterol: 184 mg/dL (ref <200)
HDL: 59 mg/dL (ref >=50)
LDL Cholesterol (Calc): 102 mg/dL (calc) — ABNORMAL HIGH
Non-HDL Cholesterol (Calc): 125 mg/dL (calc) (ref <130)
Total CHOL/HDL Ratio: 3.1 (calc) (ref <5.0)
Triglycerides: 136 mg/dL (ref <150)

## 2020-10-17 LAB — CBC WITH DIFFERENTIAL/PLATELET
Absolute Monocytes: 350 cells/uL (ref 200–950)
Basophils Absolute: 40 cells/uL (ref 0–200)
Basophils Relative: 0.6 %
Eosinophils Absolute: 158 cells/uL (ref 15–500)
Eosinophils Relative: 2.4 %
HCT: 37 % (ref 35.0–45.0)
Hemoglobin: 11.8 g/dL (ref 11.7–15.5)
Lymphs Abs: 2409 cells/uL (ref 850–3900)
MCH: 27.6 pg (ref 27.0–33.0)
MCHC: 31.9 g/dL — ABNORMAL LOW (ref 32.0–36.0)
MCV: 86.4 fL (ref 80.0–100.0)
MPV: 10.1 fL (ref 7.5–12.5)
Monocytes Relative: 5.3 %
Neutro Abs: 3643 cells/uL (ref 1500–7800)
Neutrophils Relative %: 55.2 %
Platelets: 347 10*3/uL (ref 140–400)
RBC: 4.28 10*6/uL (ref 3.80–5.10)
RDW: 14.1 % (ref 11.0–15.0)
Total Lymphocyte: 36.5 %
WBC: 6.6 10*3/uL (ref 3.8–10.8)

## 2020-10-17 LAB — TSH: TSH: 2.58 mIU/L (ref 0.40–4.50)

## 2020-10-17 MED ORDER — CLONAZEPAM 0.5 MG PO TABS: 0.5000 mg | ORAL_TABLET | Freq: Two times a day (BID) | ORAL | 1 refills | Status: DC | PRN

## 2020-10-17 NOTE — Progress Notes (Signed)
Renee Wade,   No surprises. Sugar was elevated.  Chronic kidney disease with GFR 46 a little better than last check.  Liver looks good.  LDL 102. Not terrible but goal with diabetes is under 70. Crestor could get you to that goal.

## 2020-10-22 MED ORDER — FLUCONAZOLE 150 MG PO TABS: 150.0000 mg | ORAL_TABLET | Freq: Once | ORAL | 0 refills | Status: AC

## 2020-10-22 NOTE — Addendum Note (Signed)
Addended by: Donella Stade on: 10/22/2020 08:23 AM   Modules accepted: Orders

## 2020-10-29 ENCOUNTER — Other Ambulatory Visit: Payer: Self-pay

## 2020-10-29 ENCOUNTER — Ambulatory Visit: Payer: BLUE CROSS/BLUE SHIELD | Admitting: Pharmacist

## 2020-10-29 DIAGNOSIS — I10 Essential (primary) hypertension: Secondary | ICD-10-CM

## 2020-10-29 DIAGNOSIS — N183 Chronic kidney disease, stage 3 unspecified: Secondary | ICD-10-CM

## 2020-10-29 DIAGNOSIS — E1122 Type 2 diabetes mellitus with diabetic chronic kidney disease: Secondary | ICD-10-CM

## 2020-10-29 DIAGNOSIS — E785 Hyperlipidemia, unspecified: Secondary | ICD-10-CM

## 2020-10-29 NOTE — Telephone Encounter (Signed)
Referral sent to [facility] at Patient requested. - CF

## 2020-10-29 NOTE — Patient Instructions (Signed)
Hi [name], As discussed: - Take Synjardy every other day x1 week, then try going up to daily. - Pause on rosuvastatin until we get Synjardy on board - Continue GREAT work on diet modifications! - Continue glucose monitor (RX for new sensor requested) - I will call you for another touchbase in 1 month - Call clinic & ask for pharmacist if you have any issues in the meantime!  Visit Information  PATIENT GOALS: Goals Addressed              This Visit's Progress   .  Client will verbalize knowledge of diabetes self-management as evidenced by Hgb A1C <7 or as defined by provider. (pt-stated)        Patient Goals/Self-Care Activities . Over the next 90 days, patient will:  take medications as prescribed and check glucose via scanning CGM sensor, document, and provide at future appointments  Follow Up Plan: The care management team will reach out to the patient again over the next 3 months.        Patient verbalizes understanding of instructions provided today and agrees to view in [place].   Telephone follow up appointment with care management team member scheduled for: 1 month  Renee Wade

## 2020-10-29 NOTE — Progress Notes (Signed)
Care Management   Pharmacy Note  10/29/2020 Name: Renee Wade MRN: [id number] DOB: 1960-09-12  Subjective: Renee Wade is a 60 y.o. year old female who is a primary care patient of Donella Stade, PA-C. The Care Management team was consulted for assistance with care management and care coordination needs.    Engaged with patient by telephone for follow up visit in response to provider referral for pharmacy case management and/or care coordination services.   The patient was given information about Care Management services today including:  1. Care Management services includes personalized support from designated clinical staff supervised by the patient's primary care provider, including individualized plan of care and coordination with other care providers. 2. 24/7 contact phone numbers for assistance for urgent and routine care needs. 3. The patient may stop case management services at any time by phone call to the office staff.  Patient agreed to services and consent obtained.  Assessment:  Review of patient status, including review of consultants reports, laboratory and other test data, was performed as part of comprehensive evaluation and provision of chronic care management services.   SDOH (Social Determinants of Health) assessments and interventions performed:    Objective:  Lab Results  Component Value Date   CREATININE 1.44 (H) 10/16/2020   CREATININE 1.49 (H) 04/03/2020   CREATININE 1.52 (H) 09/23/2019    Lab Results  Component Value Date   HGBA1C 10.8 (A) 10/16/2020       Component Value Date/Time   CHOL 184 10/16/2020 0000   TRIG 136 10/16/2020 0000   HDL 59 10/16/2020 0000   CHOLHDL 3.1 10/16/2020 0000   LDLCALC 102 (H) 10/16/2020 0000    BP Readings from Last 3 Encounters:  10/16/20 133/72  04/03/20 (!) 126/54  09/23/19 119/65    Care Plan  Allergies  Allergen Reactions  . Morphine Nausea And Vomiting  . Oxycodone Nausea And Vomiting   . Belviq [Lorcaserin Hcl] Cough  . Codeine Nausea And Vomiting  . Contrave [Naltrexone-Bupropion Hcl Er]     Irritable.   . Metformin And Related     Diarrhea/GI upset  . Ozempic (0.25 Or 0.5 Mg-Dose) [Semaglutide(0.25 Or 0.5mg -Dos)]     nausea  . Prednisone     Insomnia,mood changes Oral ONLY>    Medications Reviewed Today    Reviewed by Donella Stade, PA-C (Physician Assistant) on 10/17/20 at [place] List Status: <None>  Medication Order Taking? Sig Documenting Provider Last Dose Status Informant  aspirin 325 MG tablet [id number] Yes Take by mouth. [provider] Taking Active            Med Note Renae Fickle, Maryan Char   Tue Dec 18, 2015  9:14 AM) Received from: [place]  clonazePAM (KLONOPIN) 0.5 MG tablet [id number] Yes Take 1 tablet (0.5 mg total) by mouth 2 (two) times daily as needed. for anxiety Donella Stade, PA-C Taking Active   colchicine 0.6 MG tablet [id number] No Take 2 tablets as needed for gout attack and repeat 1 tablet in one hour if needed for up to 3 days.  Patient not taking: No sig reported   Donella Stade, PA-C Not Taking Active   diclofenac sodium (VOLTAREN) 1 % GEL [id number] Yes Apply 2 g topically as needed. Donella Stade, PA-C Taking Active   Empagliflozin-metFORMIN HCl ER (SYNJARDY XR) 03-999 MG TB24 [id number] Yes Take 1 tablet by mouth daily. Breeback, [name] L, PA-C  Active   escitalopram (LEXAPRO) 5 MG tablet  [id number] Yes Take 1 tablet (5 mg total) by mouth daily. Donella Stade, PA-C  Active   famotidine (PEPCID) 20 MG tablet [id number] Yes Take 1 tablet (20 mg total) by mouth 2 (two) times daily. Donella Stade, PA-C Taking Active   metroNIDAZOLE (FLAGYL) 500 MG tablet [id number] Yes Take 1 tablet (500 mg total) by mouth 2 (two) times daily for 7 days. Donella Stade, PA-C  Active   Misc Natural Products (TART CHERRY ADVANCED PO) [id number] Yes Take by mouth daily. [provider] Taking Active    olmesartan-hydrochlorothiazide (BENICAR HCT) 20-12.5 MG tablet [id number] Yes Take 1 tablet by mouth daily. Donella Stade, PA-C Taking Active   Omega 3-6-9 Fatty Acids (OMEGA 3-6-9 COMPLEX PO) [id number] Yes Take by mouth daily. [provider] Taking Active   pantoprazole (PROTONIX) 40 MG tablet [id number] Yes Take 1 tablet (40 mg total) by mouth daily. Donella Stade, PA-C Taking Active   rosuvastatin (CRESTOR) 5 MG tablet [id number] Yes Take 1 tablet (5 mg total) by mouth daily. Donella Stade, PA-C  Active           Patient Active Problem List   Diagnosis Date Noted  . Epigastric pain 10/17/2020  . Bacterial vaginosis 10/16/2020  . Menopausal symptoms 02/28/2019  . Acute idiopathic gout of left foot 05/28/2018  . Statin declined 04/05/2018  . Lower extremity edema 03/17/2018  . Dyslipidemia, goal LDL below 70 11/14/2016  . Type II diabetes mellitus with stage 3 chronic kidney disease ([place]) 11/13/2016  . It band syndrome, left 11/12/2016  . Trochanteric bursitis of left hip 11/12/2016  . Adenomatous polyp 02/13/2016  . Ganglion cyst of wrist, left 01/08/2016  . Esophageal stenosis 12/18/2015  . Gastroesophageal reflux disease with esophagitis 12/18/2015  . Essential hypertension, benign 12/18/2015  . History of DVT (deep vein thrombosis) 12/18/2015  . Anxiety state 12/18/2015  . CKD (chronic kidney disease) stage 3, GFR 30-59 ml/min (HCC) 12/18/2015    Conditions to be addressed/monitored: HTN, HLD and DMII  Care Plan : Medication Management  Updates made by Darius Bump, [place] since 10/29/2020 12:00 AM    Problem: DM, HLD   Priority: High  Onset Date: 10/16/2020    Long-Range Goal: Disease Progression Prevention   Start Date: 10/16/2020  This Visit's Progress: On track  Priority: High  Note:   Current Barriers:  . Unable to achieve control of blood glucose   Pharmacist Clinical Goal(s):  Marland Kitchen Over the next 90days, patient will adhere to plan to optimize  therapeutic regimen for diabetes as evidenced by report of adherence to recommended medication management changes . contact provider office for questions/concerns as evidenced notation of same in electronic health record through collaboration with PharmD and provider.   Interventions: . 1:1 collaboration with Donella Stade, PA-C regarding development and update of comprehensive plan of care as evidenced by provider attestation and co-signature . Inter-disciplinary care team collaboration (see longitudinal plan of care) . Comprehensive medication review performed; medication list updated in electronic medical record  Diabetes: . Uncontrolled; [previously tried metformin & ozempic], began Synjardy XR at previous PCP appt 10/16/20 . Current glucose readings: initiated CGM via [employer] on 10/16/20. Data as follows: Date of Download: 10/15/20 - 10/28/20 % Time CGM is active: 79% Average Glucose: 211 mg/dL Glucose Management Indicator: 8.4%  Glucose Variability: 18.3% (goal <36%) Time in Goal:  - Time in range 70-180: 23% - Time above range: 81% - Time below range: 0% Observed patterns:  Largest glucose spike occurs ~11am to noon each day . Reports hypoglycemic symptoms when BG was at 147, advised that this will improve over time as body adjusts . Current meal patterns: (just finished vacation)  B: oatmeal, grits, sometimes eggs, gravy & biscuits  L: salads, meat (with dressing) D: fried fish, fried food in moderation, sweet potato fries in place of regular fries Drinks/snacks: switched to 1/2 sweet +1/2 unsweet tea, cashews + nuts, wheat thins, cheese, cream cheese . Educated on continuous glucose monitoring application technique, goals, and duration of sensor use,  . Assessed new medication: patient experienced nausea, held off on starting medication while on vacation. Restarted upon return by cutting pills in half. Advised that XR formulation cannot be split/crushed, but compromised  with plan of taking every other day x1 week & then daily; patient is to call clinic for pharmacist if cannot manage nausea past 1-2 weeks.  . Patient needs RX for CGM; plan is to continue CGM until we can assess medication changes.   Hypertension: . Controlled; benicar HCT   . Denies hypotensive/hypertensive symptoms . Assessed vitals, continue current regimen, and  Hyperlipidemia: . Uncontrolled; recent initiation of rosuvastatin 5mg  at PCP appt 10/16/20 . Assessed new medication: did not start due to not wanting to start more than one new medicine at a time. Recommended to begin medication after 2-3 weeks of acclimating to [place].   . Plan: Increase as tolerated,with goal dose of 20mg : recommend repeat lipid panel ~33months after starting rosuvastatin 5mg , if LDL >70 and medication tolerated, increase to 20mg .  Patient Goals/Self-Care Activities . Over the next 90 days, patient will:  take medications as prescribed and check glucose via scanning CGM sensor, document, and provide at future appointments  Follow Up Plan: The care management team will reach out to the patient again over the next 1 month.      Medication Assistance:  None required.  Patient affirms current coverage meets needs.  Follow Up:  Patient agrees to Care Plan and Follow-up.  Plan: Patient needs RX for CGM, notified PCP  Telephone follow up appointment with care management team member scheduled for:  1 month  Darius Bump

## 2020-11-26 DIAGNOSIS — Z1231 Encounter for screening mammogram for malignant neoplasm of breast: Secondary | ICD-10-CM | POA: Diagnosis not present

## 2020-11-26 LAB — HM MAMMOGRAPHY

## 2020-11-30 ENCOUNTER — Telehealth: Payer: BLUE CROSS/BLUE SHIELD

## 2020-11-30 ENCOUNTER — Telehealth: Payer: Self-pay | Admitting: Pharmacist

## 2020-11-30 NOTE — Telephone Encounter (Signed)
Patient has been removed off schedule. AM

## 2020-11-30 NOTE — Telephone Encounter (Signed)
Unable to reach patient for follow up telephone visit for chronic care management with pharmacist, left VM.   Routing to schedule team for reschedule.   Renee Wade

## 2020-12-04 MED ORDER — FREESTYLE LIBRE 14 DAY READER DEVI: 1.0000 | Freq: Once | 0 refills | Status: AC

## 2020-12-04 MED ORDER — FREESTYLE LIBRE 14 DAY SENSOR MISC: 1.0000 | 11 refills | Status: DC

## 2020-12-04 NOTE — Addendum Note (Signed)
Addended by: Donella Stade on: 12/04/2020 10:52 AM   Modules accepted: Orders

## 2021-01-01 ENCOUNTER — Other Ambulatory Visit: Payer: Self-pay | Admitting: Neurology

## 2021-01-01 DIAGNOSIS — N183 Chronic kidney disease, stage 3 unspecified: Secondary | ICD-10-CM

## 2021-01-01 MED ORDER — SYNJARDY XR 10-1000 MG PO TB24: 1.0000 | ORAL_TABLET | Freq: Every day | ORAL | 0 refills | Status: DC

## 2021-01-02 ENCOUNTER — Other Ambulatory Visit: Payer: Self-pay | Admitting: Physician Assistant

## 2021-01-02 DIAGNOSIS — F411 Generalized anxiety disorder: Secondary | ICD-10-CM

## 2021-01-24 ENCOUNTER — Encounter: Payer: Self-pay | Admitting: Physician Assistant

## 2021-01-25 NOTE — Telephone Encounter (Signed)
Called [name] and scheduled her husband Renee Wade for a new pt appt.  Charyl Bigger, CMA

## 2021-02-28 DIAGNOSIS — H5213 Myopia, bilateral: Secondary | ICD-10-CM | POA: Diagnosis not present

## 2021-02-28 DIAGNOSIS — E119 Type 2 diabetes mellitus without complications: Secondary | ICD-10-CM | POA: Diagnosis not present

## 2021-02-28 LAB — HM DIABETES EYE EXAM

## 2021-03-29 ENCOUNTER — Encounter: Payer: Self-pay | Admitting: Physician Assistant

## 2021-03-29 DIAGNOSIS — E1122 Type 2 diabetes mellitus with diabetic chronic kidney disease: Secondary | ICD-10-CM

## 2021-03-29 DIAGNOSIS — F411 Generalized anxiety disorder: Secondary | ICD-10-CM

## 2021-03-29 DIAGNOSIS — N183 Chronic kidney disease, stage 3 unspecified: Secondary | ICD-10-CM

## 2021-03-29 MED ORDER — CLONAZEPAM 0.5 MG PO TABS: 0.5000 mg | ORAL_TABLET | Freq: Two times a day (BID) | ORAL | 1 refills | Status: DC | PRN

## 2021-03-29 MED ORDER — SYNJARDY XR 10-1000 MG PO TB24
1.0000 | ORAL_TABLET | Freq: Every day | ORAL | 1 refills | Status: DC
Start: 2021-03-29 — End: 2021-03-29

## 2021-03-29 MED ORDER — SYNJARDY XR 10-1000 MG PO TB24: 1.0000 | ORAL_TABLET | Freq: Every day | ORAL | 1 refills | Status: DC

## 2021-04-01 ENCOUNTER — Ambulatory Visit: Payer: BC Managed Care – PPO | Admitting: Physician Assistant

## 2021-04-01 ENCOUNTER — Encounter: Payer: Self-pay | Admitting: Physician Assistant

## 2021-04-01 ENCOUNTER — Other Ambulatory Visit: Payer: Self-pay

## 2021-04-01 VITALS — BP 112/58 | HR 62 | Ht 69.0 in | Wt 266.0 lb

## 2021-04-01 DIAGNOSIS — Z5320 Procedure and treatment not carried out because of patient's decision for unspecified reasons: Secondary | ICD-10-CM | POA: Diagnosis not present

## 2021-04-01 DIAGNOSIS — N183 Chronic kidney disease, stage 3 unspecified: Secondary | ICD-10-CM

## 2021-04-01 DIAGNOSIS — I10 Essential (primary) hypertension: Secondary | ICD-10-CM

## 2021-04-01 DIAGNOSIS — E1122 Type 2 diabetes mellitus with diabetic chronic kidney disease: Secondary | ICD-10-CM | POA: Diagnosis not present

## 2021-04-01 DIAGNOSIS — K2100 Gastro-esophageal reflux disease with esophagitis, without bleeding: Secondary | ICD-10-CM

## 2021-04-01 LAB — POCT GLYCOSYLATED HEMOGLOBIN (HGB A1C): Hemoglobin A1C: 7.2 % — AB (ref 4.0–5.6)

## 2021-04-01 MED ORDER — OLMESARTAN MEDOXOMIL-HCTZ 20-12.5 MG PO TABS: 1.0000 | ORAL_TABLET | Freq: Every day | ORAL | 3 refills | Status: DC

## 2021-04-01 MED ORDER — PANTOPRAZOLE SODIUM 40 MG PO TBEC: 40.0000 mg | DELAYED_RELEASE_TABLET | Freq: Every day | ORAL | 3 refills | Status: DC

## 2021-04-01 NOTE — Progress Notes (Signed)
Subjective:    Patient ID: Renee Wade, female    DOB: Oct 23, 1960, 60 y.o.   MRN: [id number]  HPI [name] a 60 yo obese female with T2DM, HTN, HLD, anxiety who presents to the clinic for follow up.   Her anxiety is much worse over past few weeks because her best friends husband killed himself. She is helping her cope. Klonapin was sent in already for refills. Off lexapro. Denies any SI/HC.   Her last a1c was over 10 but she has made diet changes and walking more. She did restart synjardy. She has had more yeast infections but treating OTC. No open sores or wounds. She does check her sugars from time to time and running more like 130-150's now.   .. Active Ambulatory Problems    Diagnosis Date Noted   Esophageal stenosis 12/18/2015   Gastroesophageal reflux disease with esophagitis 12/18/2015   Essential hypertension, benign 12/18/2015   History of DVT (deep vein thrombosis) 12/18/2015   Anxiety state 12/18/2015   CKD (chronic kidney disease) stage 3, GFR 30-59 ml/min (HCC) 12/18/2015   Ganglion cyst of wrist, left 01/08/2016   Adenomatous polyp 02/13/2016   It band syndrome, left 11/12/2016   Trochanteric bursitis of left hip 11/12/2016   Type II diabetes mellitus with stage 3 chronic kidney disease ([place]) 11/13/2016   Dyslipidemia, goal LDL below 70 11/14/2016   Lower extremity edema 03/17/2018   Statin declined 04/05/2018   Acute idiopathic gout of left foot 05/28/2018   Menopausal symptoms 02/28/2019   Bacterial vaginosis 10/16/2020   Epigastric pain 10/17/2020   Resolved Ambulatory Problems    Diagnosis Date Noted   Class 2 severe obesity due to excess calories with serious comorbidity and body mass index (BMI) of 39.0 to 39.9 in adult ([place]) 12/19/2015   BMI 39.0-39.9,adult 02/01/2017   Past Medical History:  Diagnosis Date   Depression    H/O DVT    Hypertension    Obesity       Review of Systems See HPI.     Objective:   Physical Exam Vitals reviewed.   Constitutional:      Appearance: Normal appearance. She is obese.  HENT:     Head: Normocephalic.  Neck:     Vascular: No carotid bruit.  Cardiovascular:     Rate and Rhythm: Normal rate and regular rhythm.     Pulses: Normal pulses.  Pulmonary:     Effort: Pulmonary effort is normal.     Breath sounds: Normal breath sounds.  Neurological:     General: No focal deficit present.     Mental Status: She is alert and oriented to person, place, and time.  Psychiatric:        Mood and Affect: Mood normal.     .. Results for orders placed or performed in visit on 04/01/21  POCT glycosylated hemoglobin (Hb A1C)  Result Value Ref Range   Hemoglobin A1C 7.2 (A) 4.0 - 5.6 %   HbA1c POC (<> result, manual entry)     HbA1c, POC (prediabetic range)     HbA1c, POC (controlled diabetic range)          Assessment & Plan:  Marland [name] KitchenVianka was seen today for anxiety and diabetes.  Diagnoses and all orders for this visit:  Type 2 diabetes mellitus with stage 3 chronic kidney disease, without long-term current use of insulin, unspecified whether stage 3a or 3b CKD (HCC) -     POCT glycosylated hemoglobin (Hb A1C)  Gastroesophageal reflux disease with esophagitis -     pantoprazole (PROTONIX) 40 MG tablet; Take 1 tablet (40 mg total) by mouth daily.  Essential hypertension, benign -     olmesartan-hydrochlorothiazide (BENICAR HCT) 20-12.5 MG tablet; Take 1 tablet by mouth daily.  A1C much better but not quite to goal.  Continue medications.  Having recurrent yeast infections. Discussed med change but patient wants to stay on this for now. Treating OtC.  Continue diet and exercise changes.  BP to goal. Refilled today.  Pt declined statin. Stopped crestor that was ordered at last visit.  Declined flu shot. Pneumonia and covid vaccine UTD.  Follow up in 3 months.   GERD controlled. Refilled.

## 2021-04-03 ENCOUNTER — Other Ambulatory Visit: Payer: Self-pay | Admitting: Neurology

## 2021-04-03 ENCOUNTER — Other Ambulatory Visit: Payer: Self-pay

## 2021-04-03 DIAGNOSIS — N183 Chronic kidney disease, stage 3 unspecified: Secondary | ICD-10-CM

## 2021-04-03 DIAGNOSIS — K2100 Gastro-esophageal reflux disease with esophagitis, without bleeding: Secondary | ICD-10-CM

## 2021-04-03 MED ORDER — PANTOPRAZOLE SODIUM 40 MG PO TBEC: 40.0000 mg | DELAYED_RELEASE_TABLET | Freq: Every day | ORAL | 3 refills | Status: DC

## 2021-04-03 MED ORDER — SYNJARDY XR 10-1000 MG PO TB24: 1.0000 | ORAL_TABLET | Freq: Every day | ORAL | 1 refills | Status: DC

## 2021-04-05 ENCOUNTER — Encounter: Payer: Self-pay | Admitting: Physician Assistant

## 2021-04-05 DIAGNOSIS — K2100 Gastro-esophageal reflux disease with esophagitis, without bleeding: Secondary | ICD-10-CM

## 2021-04-05 DIAGNOSIS — I10 Essential (primary) hypertension: Secondary | ICD-10-CM

## 2021-04-05 MED ORDER — PANTOPRAZOLE SODIUM 40 MG PO TBEC
40.0000 mg | DELAYED_RELEASE_TABLET | Freq: Every day | ORAL | 3 refills | Status: DC
Start: 2021-04-05 — End: 2021-10-02

## 2021-04-05 MED ORDER — OLMESARTAN MEDOXOMIL-HCTZ 20-12.5 MG PO TABS: 1.0000 | ORAL_TABLET | Freq: Every day | ORAL | 3 refills | Status: DC

## 2021-07-03 ENCOUNTER — Encounter: Payer: Self-pay | Admitting: Physician Assistant

## 2021-07-03 ENCOUNTER — Other Ambulatory Visit: Payer: Self-pay

## 2021-07-03 ENCOUNTER — Ambulatory Visit: Payer: BC Managed Care – PPO | Admitting: Physician Assistant

## 2021-07-03 ENCOUNTER — Other Ambulatory Visit (HOSPITAL_BASED_OUTPATIENT_CLINIC_OR_DEPARTMENT_OTHER): Payer: Self-pay

## 2021-07-03 VITALS — BP 130/82 | HR 81 | Ht 69.0 in | Wt 264.0 lb

## 2021-07-03 DIAGNOSIS — Z789 Other specified health status: Secondary | ICD-10-CM

## 2021-07-03 DIAGNOSIS — F411 Generalized anxiety disorder: Secondary | ICD-10-CM

## 2021-07-03 DIAGNOSIS — N183 Chronic kidney disease, stage 3 unspecified: Secondary | ICD-10-CM

## 2021-07-03 DIAGNOSIS — Z79899 Other long term (current) drug therapy: Secondary | ICD-10-CM | POA: Diagnosis not present

## 2021-07-03 DIAGNOSIS — E785 Hyperlipidemia, unspecified: Secondary | ICD-10-CM | POA: Diagnosis not present

## 2021-07-03 DIAGNOSIS — E1122 Type 2 diabetes mellitus with diabetic chronic kidney disease: Secondary | ICD-10-CM | POA: Diagnosis not present

## 2021-07-03 DIAGNOSIS — Z1329 Encounter for screening for other suspected endocrine disorder: Secondary | ICD-10-CM

## 2021-07-03 LAB — POCT GLYCOSYLATED HEMOGLOBIN (HGB A1C): Hemoglobin A1C: 8.2 % — AB (ref 4.0–5.6)

## 2021-07-03 MED ORDER — REPATHA 140 MG/ML ~~LOC~~ SOSY
140.0000 mg | PREFILLED_SYRINGE | SUBCUTANEOUS | 1 refills | Status: DC
  Filled 2021-07-03: qty 6, 84d supply, fill #0

## 2021-07-03 MED ORDER — TRULICITY 0.75 MG/0.5ML ~~LOC~~ SOAJ: 0.7500 mg | SUBCUTANEOUS | 2 refills | Status: DC

## 2021-07-03 MED ORDER — CLONAZEPAM 0.5 MG PO TABS: 0.5000 mg | ORAL_TABLET | Freq: Two times a day (BID) | ORAL | 0 refills | Status: DC | PRN

## 2021-07-03 MED ORDER — SYNJARDY XR 10-1000 MG PO TB24: 1.0000 | ORAL_TABLET | Freq: Every day | ORAL | 1 refills | Status: DC

## 2021-07-03 NOTE — Progress Notes (Signed)
Subjective:    Patient ID: Renee Wade, female    DOB: 1960-07-19, 61 y.o.   MRN: [id number]  HPI [name] a 61 yo obese femaleemale with T2DM, HTN, GERD, CKD, HDL who presents to the clinic for 3 month follow up.   [name] not checking sugars. [name] taking synjardy. No open sores or wound. No hypoglycemic events. Not exercising. Not watching diet. Drinking Dr. Samson Frederic daily. She is not on statin because she has muscle aches with lipitor and crestor. No CP, palpitations, headaches or vision changes. No abdominal pain.   Needs klonapin refilled.  .. Lab Results  Component Value Date   HGBA1C 7.2 (A) 04/01/2021   .Marland Kitchen Active Ambulatory Problems    Diagnosis Date Noted   Esophageal stenosis 12/18/2015   Gastroesophageal reflux disease with esophagitis 12/18/2015   Essential hypertension, benign 12/18/2015   History of DVT (deep vein thrombosis) 12/18/2015   Anxiety state 12/18/2015   CKD (chronic kidney disease) stage 3, GFR 30-59 ml/min (HCC) 12/18/2015   Ganglion cyst of wrist, left 01/08/2016   Adenomatous polyp 02/13/2016   It band syndrome, left 11/12/2016   Trochanteric bursitis of left hip 11/12/2016   Type II diabetes mellitus with stage 3 chronic kidney disease ([place]) 11/13/2016   Dyslipidemia, goal LDL below 70 11/14/2016   Lower extremity edema 03/17/2018   Statin declined 04/05/2018   Acute idiopathic gout of left foot 05/28/2018   Menopausal symptoms 02/28/2019   Bacterial vaginosis 10/16/2020   Epigastric pain 10/17/2020   Resolved Ambulatory Problems    Diagnosis Date Noted   Class 2 severe obesity due to excess calories with serious comorbidity and body mass index (BMI) of 39.0 to 39.9 in adult ([place]) 12/19/2015   BMI 39.0-39.9,adult 02/01/2017   Past Medical History:  Diagnosis Date   Depression    H/O DVT    Hypertension    Obesity      Review of Systems  All other systems reviewed and are negative.     Objective:   Physical Exam Vitals reviewed.   Constitutional:      Appearance: Normal appearance.  HENT:     Head: Normocephalic.  Neck:     Vascular: No carotid bruit.  Cardiovascular:     Rate and Rhythm: Normal rate and regular rhythm.     Pulses: Normal pulses.     Heart sounds: Normal heart sounds.  Pulmonary:     Effort: Pulmonary effort is normal.     Breath sounds: Normal breath sounds.  Musculoskeletal:     Right lower leg: No edema.     Left lower leg: No edema.  Neurological:     General: No focal deficit present.     Mental Status: She is alert and oriented to person, place, and time.  Psychiatric:        Mood and Affect: Mood normal.   .. GAD 7 : Generalized Anxiety Score 10/16/2020 04/03/2020 02/25/2019 11/25/2018  Nervous, Anxious, on Edge 2 1 1  -  Control/stop worrying 2 1 1 2   Worry too much - different things 2 1 1 2   Trouble relaxing 3 2 1 2   Restless 1 2 0 0  Easily annoyed or irritable 3 2 0 0  Afraid - awful might happen 1 1 0 2  Total GAD 7 Score 14 10 4  -  Anxiety Difficulty Somewhat difficult Somewhat difficult Not difficult at all Not difficult at all     Results for orders placed or performed  in visit on 07/03/21  POCT glycosylated hemoglobin (Hb A1C)  Result Value Ref Range   Hemoglobin A1C 8.2 (A) 4.0 - 5.6 %   HbA1c POC (<> result, manual entry)     HbA1c, POC (prediabetic range)     HbA1c, POC (controlled diabetic range)             Assessment & Plan:  Marland [name] KitchenDareen was seen today for follow-up.  Diagnoses and all orders for this visit:  Type 2 diabetes mellitus with stage 3 chronic kidney disease, without long-term current use of insulin, unspecified whether stage 3a or 3b CKD (HCC) -     COMPLETE METABOLIC PANEL WITH GFR -     POCT glycosylated hemoglobin (Hb A1C) -     Empagliflozin-metFORMIN HCl ER (SYNJARDY XR) 03-999 MG TB24; Take 1 tablet by mouth daily. -     Dulaglutide (TRULICITY) 7.20 NO/7.0JG SOPN; Inject 0.75 mg into the skin once a week.  Dyslipidemia, goal LDL  below 70 -     Lipid Panel w/reflex Direct LDL -     Evolocumab (REPATHA) 140 MG/ML SOSY; Inject 140 mg into the skin every 14 (fourteen) days.  Medication management -     TSH -     Lipid Panel w/reflex Direct LDL -     COMPLETE METABOLIC PANEL WITH GFR -     CBC with Differential/Platelet -     POCT glycosylated hemoglobin (Hb A1C)  Thyroid disorder screen -     TSH  GAD (generalized anxiety disorder)  Anxiety state -     clonazePAM (KLONOPIN) 0.5 MG tablet; Take 1 tablet (0.5 mg total) by mouth 2 (two) times daily as needed. for anxiety  Statin intolerance  Other orders -     Discontinue: Dulaglutide (TRULICITY) 2.83 MO/2.9UT SOPN; Inject 0.75 mg into the skin once a week.   A1C is not to goal.  Failed ozempic will try trulicity. Continue xigduo. Discussed regular exercise and diet changes. She has been drinking more dr peppers.she agrees to cut back.  BP to goal. Needs statin but does not tolerate. Family hx of CV risk. LDL not to goal. Failed 2 statins. Sent repatha.  Fasting labs ordered. UTD eye and foot exam. Declined any more covid vaccines. Declined flu, pneumonia and shingles vaccine  Tax season and needs klonapin refilled.

## 2021-07-04 ENCOUNTER — Other Ambulatory Visit (HOSPITAL_BASED_OUTPATIENT_CLINIC_OR_DEPARTMENT_OTHER): Payer: Self-pay

## 2021-07-04 ENCOUNTER — Encounter: Payer: Self-pay | Admitting: Physician Assistant

## 2021-07-04 ENCOUNTER — Telehealth: Payer: Self-pay

## 2021-07-04 NOTE — Telephone Encounter (Signed)
Medication: Dulaglutide (TRULICITY) 0.10 OF/1.2RF SOPN Prior authorization submitted via CoverMyMeds on 07/04/2021 PA submission pending

## 2021-07-05 ENCOUNTER — Encounter: Payer: Self-pay | Admitting: Physician Assistant

## 2021-07-05 ENCOUNTER — Other Ambulatory Visit (HOSPITAL_BASED_OUTPATIENT_CLINIC_OR_DEPARTMENT_OTHER): Payer: Self-pay

## 2021-07-08 ENCOUNTER — Other Ambulatory Visit (HOSPITAL_BASED_OUTPATIENT_CLINIC_OR_DEPARTMENT_OTHER): Payer: Self-pay

## 2021-07-09 ENCOUNTER — Other Ambulatory Visit (HOSPITAL_BASED_OUTPATIENT_CLINIC_OR_DEPARTMENT_OTHER): Payer: Self-pay

## 2021-07-10 ENCOUNTER — Other Ambulatory Visit (HOSPITAL_BASED_OUTPATIENT_CLINIC_OR_DEPARTMENT_OTHER): Payer: Self-pay

## 2021-07-10 ENCOUNTER — Encounter: Payer: Self-pay | Admitting: Physician Assistant

## 2021-07-10 NOTE — Telephone Encounter (Signed)
Repatha PA submitted via Covermymeds.   Your information has been submitted to [place]. [employer] will review the request and notify you of the determination decision directly, typically within 72 hours of receiving all information.  You will also receive your request decision electronically. To check for an update later, open this request again from your dashboard.  If [employer] [place] has not responded within the specified timeframe or if you have any questions about your PA submission, contact [employer] [place] directly at [phone].

## 2021-07-11 ENCOUNTER — Other Ambulatory Visit (HOSPITAL_BASED_OUTPATIENT_CLINIC_OR_DEPARTMENT_OTHER): Payer: Self-pay

## 2021-07-12 ENCOUNTER — Other Ambulatory Visit (HOSPITAL_BASED_OUTPATIENT_CLINIC_OR_DEPARTMENT_OTHER): Payer: Self-pay

## 2021-07-12 NOTE — Telephone Encounter (Signed)
Received Notice of Denial  Placed in Breeback's basket

## 2021-07-15 ENCOUNTER — Other Ambulatory Visit (HOSPITAL_BASED_OUTPATIENT_CLINIC_OR_DEPARTMENT_OTHER): Payer: Self-pay

## 2021-07-16 ENCOUNTER — Other Ambulatory Visit (HOSPITAL_BASED_OUTPATIENT_CLINIC_OR_DEPARTMENT_OTHER): Payer: Self-pay

## 2021-07-17 ENCOUNTER — Telehealth: Payer: Self-pay

## 2021-07-17 NOTE — Telephone Encounter (Addendum)
Initiated Prior authorization [id number] 140MG /ML syringes Via: Covermymeds Case/Key: [id number] Status: approved  as of 07/17/21 Reason: your coverage start date is back dated 07/10/2021-07/09/2022 Notified Pt via: Mychart

## 2021-07-18 ENCOUNTER — Other Ambulatory Visit (HOSPITAL_BASED_OUTPATIENT_CLINIC_OR_DEPARTMENT_OTHER): Payer: Self-pay

## 2021-07-19 ENCOUNTER — Other Ambulatory Visit (HOSPITAL_BASED_OUTPATIENT_CLINIC_OR_DEPARTMENT_OTHER): Payer: Self-pay

## 2021-07-22 ENCOUNTER — Other Ambulatory Visit (HOSPITAL_BASED_OUTPATIENT_CLINIC_OR_DEPARTMENT_OTHER): Payer: Self-pay

## 2021-07-23 ENCOUNTER — Other Ambulatory Visit (HOSPITAL_BASED_OUTPATIENT_CLINIC_OR_DEPARTMENT_OTHER): Payer: Self-pay

## 2021-07-24 ENCOUNTER — Other Ambulatory Visit (HOSPITAL_BASED_OUTPATIENT_CLINIC_OR_DEPARTMENT_OTHER): Payer: Self-pay

## 2021-08-01 ENCOUNTER — Other Ambulatory Visit (HOSPITAL_BASED_OUTPATIENT_CLINIC_OR_DEPARTMENT_OTHER): Payer: Self-pay

## 2021-08-02 ENCOUNTER — Other Ambulatory Visit (HOSPITAL_BASED_OUTPATIENT_CLINIC_OR_DEPARTMENT_OTHER): Payer: Self-pay

## 2021-09-09 ENCOUNTER — Other Ambulatory Visit: Payer: Self-pay | Admitting: Physician Assistant

## 2021-09-09 DIAGNOSIS — E1122 Type 2 diabetes mellitus with diabetic chronic kidney disease: Secondary | ICD-10-CM

## 2021-09-13 ENCOUNTER — Other Ambulatory Visit: Payer: Self-pay | Admitting: Physician Assistant

## 2021-09-13 DIAGNOSIS — E1122 Type 2 diabetes mellitus with diabetic chronic kidney disease: Secondary | ICD-10-CM

## 2021-09-25 DIAGNOSIS — E1122 Type 2 diabetes mellitus with diabetic chronic kidney disease: Secondary | ICD-10-CM | POA: Diagnosis not present

## 2021-09-25 DIAGNOSIS — E785 Hyperlipidemia, unspecified: Secondary | ICD-10-CM | POA: Diagnosis not present

## 2021-09-25 DIAGNOSIS — N183 Chronic kidney disease, stage 3 unspecified: Secondary | ICD-10-CM | POA: Diagnosis not present

## 2021-09-25 DIAGNOSIS — Z79899 Other long term (current) drug therapy: Secondary | ICD-10-CM | POA: Diagnosis not present

## 2021-09-26 LAB — COMPLETE METABOLIC PANEL WITH GFR
AG Ratio: 1.5 (calc) (ref 1.0–2.5)
ALT: 24 U/L (ref 6–29)
AST: 21 U/L (ref 10–35)
Albumin: 4 g/dL (ref 3.6–5.1)
Alkaline phosphatase (APISO): 60 U/L (ref 37–153)
BUN/Creatinine Ratio: 11 (calc) (ref 6–22)
BUN: 16 mg/dL (ref 7–25)
CO2: 26 mmol/L (ref 20–32)
Calcium: 9.7 mg/dL (ref 8.6–10.4)
Chloride: 107 mmol/L (ref 98–110)
Creat: 1.49 mg/dL — ABNORMAL HIGH (ref 0.50–1.05)
Globulin: 2.7 g/dL (calc) (ref 1.9–3.7)
Glucose, Bld: 122 mg/dL — ABNORMAL HIGH (ref 65–99)
Potassium: 3.9 mmol/L (ref 3.5–5.3)
Sodium: 142 mmol/L (ref 135–146)
Total Bilirubin: 1 mg/dL (ref 0.2–1.2)
Total Protein: 6.7 g/dL (ref 6.1–8.1)
eGFR: 40 mL/min/{1.73_m2} — ABNORMAL LOW (ref >=60)

## 2021-09-26 LAB — LIPID PANEL W/REFLEX DIRECT LDL
Cholesterol: 119 mg/dL (ref <200)
HDL: 60 mg/dL (ref >=50)
LDL Cholesterol (Calc): 44 mg/dL (calc)
Non-HDL Cholesterol (Calc): 59 mg/dL (calc) (ref <130)
Total CHOL/HDL Ratio: 2 (calc) (ref <5.0)
Triglycerides: 72 mg/dL (ref <150)

## 2021-09-26 LAB — CBC WITH DIFFERENTIAL/PLATELET
Absolute Monocytes: 464 cells/uL (ref 200–950)
Basophils Absolute: 36 cells/uL (ref 0–200)
Basophils Relative: 0.4 %
Eosinophils Absolute: 100 cells/uL (ref 15–500)
Eosinophils Relative: 1.1 %
HCT: 38.2 % (ref 35.0–45.0)
Hemoglobin: 11.9 g/dL (ref 11.7–15.5)
Lymphs Abs: 2111 cells/uL (ref 850–3900)
MCH: 27.1 pg (ref 27.0–33.0)
MCHC: 31.2 g/dL — ABNORMAL LOW (ref 32.0–36.0)
MCV: 87 fL (ref 80.0–100.0)
MPV: 9.9 fL (ref 7.5–12.5)
Monocytes Relative: 5.1 %
Neutro Abs: 6388 cells/uL (ref 1500–7800)
Neutrophils Relative %: 70.2 %
Platelets: 388 10*3/uL (ref 140–400)
RBC: 4.39 10*6/uL (ref 3.80–5.10)
RDW: 15 % (ref 11.0–15.0)
Total Lymphocyte: 23.2 %
WBC: 9.1 10*3/uL (ref 3.8–10.8)

## 2021-09-26 LAB — TSH: TSH: 2.29 mIU/L (ref 0.40–4.50)

## 2021-10-02 ENCOUNTER — Encounter: Payer: Self-pay | Admitting: Physician Assistant

## 2021-10-02 ENCOUNTER — Ambulatory Visit: Payer: BC Managed Care – PPO | Admitting: Physician Assistant

## 2021-10-02 ENCOUNTER — Other Ambulatory Visit (HOSPITAL_BASED_OUTPATIENT_CLINIC_OR_DEPARTMENT_OTHER): Payer: Self-pay

## 2021-10-02 VITALS — BP 118/54 | HR 77 | Ht 69.0 in | Wt 258.0 lb

## 2021-10-02 DIAGNOSIS — N183 Chronic kidney disease, stage 3 unspecified: Secondary | ICD-10-CM

## 2021-10-02 DIAGNOSIS — F411 Generalized anxiety disorder: Secondary | ICD-10-CM

## 2021-10-02 DIAGNOSIS — K2100 Gastro-esophageal reflux disease with esophagitis, without bleeding: Secondary | ICD-10-CM

## 2021-10-02 DIAGNOSIS — E1122 Type 2 diabetes mellitus with diabetic chronic kidney disease: Secondary | ICD-10-CM | POA: Diagnosis not present

## 2021-10-02 DIAGNOSIS — E785 Hyperlipidemia, unspecified: Secondary | ICD-10-CM | POA: Diagnosis not present

## 2021-10-02 DIAGNOSIS — M71122 Other infective bursitis, left elbow: Secondary | ICD-10-CM | POA: Insufficient documentation

## 2021-10-02 DIAGNOSIS — I10 Essential (primary) hypertension: Secondary | ICD-10-CM

## 2021-10-02 LAB — POCT GLYCOSYLATED HEMOGLOBIN (HGB A1C): Hemoglobin A1C: 6.2 % — AB (ref 4.0–5.6)

## 2021-10-02 MED ORDER — DOXYCYCLINE HYCLATE 100 MG PO TABS: 100.0000 mg | ORAL_TABLET | Freq: Two times a day (BID) | ORAL | 0 refills | Status: DC

## 2021-10-02 MED ORDER — TRULICITY 0.75 MG/0.5ML ~~LOC~~ SOAJ: 0.7500 mg | SUBCUTANEOUS | 2 refills | Status: DC

## 2021-10-02 MED ORDER — SYNJARDY XR 10-1000 MG PO TB24: 1.0000 | ORAL_TABLET | Freq: Every day | ORAL | 0 refills | Status: DC

## 2021-10-02 MED ORDER — PANTOPRAZOLE SODIUM 40 MG PO TBEC: 40.0000 mg | DELAYED_RELEASE_TABLET | Freq: Every day | ORAL | 3 refills | Status: DC

## 2021-10-02 MED ORDER — REPATHA SURECLICK 140 MG/ML ~~LOC~~ SOAJ
140.0000 mg | SUBCUTANEOUS | 3 refills | Status: DC
  Filled 2021-10-02: qty 6, 84d supply, fill #0
  Filled 2021-12-20: qty 6, 84d supply, fill #1
  Filled 2022-03-14: qty 6, 84d supply, fill #2
  Filled 2022-06-06 (×2): qty 6, 84d supply, fill #3

## 2021-10-02 MED ORDER — CLONAZEPAM 0.5 MG PO TABS: 0.5000 mg | ORAL_TABLET | Freq: Two times a day (BID) | ORAL | 1 refills | Status: DC | PRN

## 2021-10-02 MED ORDER — OLMESARTAN MEDOXOMIL-HCTZ 20-12.5 MG PO TABS: 1.0000 | ORAL_TABLET | Freq: Every day | ORAL | 3 refills | Status: DC

## 2021-10-02 NOTE — Progress Notes (Signed)
? ?Established Patient Office Visit ? ?Subjective   ?Patient ID: Renee Wade, female    DOB: 1960/09/12  Age: 61 y.o. MRN: [id number] ? ?Chief Complaint  ?Patient presents with  ? Diabetes  ? Follow-up  ? ? ?HPI ?[name] a 61 yo obese female who presents for 3 month follow up and medication refills.  ? ?[name] doing great. Not checking sugars. She is taking trulicity and noticed weight loss and feeling better. Not exercising. No open sores or wounds. No hypoglycemic events. She does not like the injection of repatha and wants to consider auto injector. Denies any CP, palpitations, headaches or vision changes.  ? ?She has a swollen left elbow with sore that will not heal. It has started to crust and re-open at times. It is tender and red. No fever or chills. She admits to leaning on that elbow a lot during tax season.  ? ? ? ?Patient Active Problem List  ? Diagnosis Date Noted  ? Infected olecranon bursa, left 10/02/2021  ? Epigastric pain 10/17/2020  ? Bacterial vaginosis 10/16/2020  ? Menopausal symptoms 02/28/2019  ? Acute idiopathic gout of left foot 05/28/2018  ? Statin declined 04/05/2018  ? Lower extremity edema 03/17/2018  ? Dyslipidemia, goal LDL below 70 11/14/2016  ? Type II diabetes mellitus with stage 3 chronic kidney disease ([place]) 11/13/2016  ? It band syndrome, left 11/12/2016  ? Trochanteric bursitis of left hip 11/12/2016  ? Adenomatous polyp 02/13/2016  ? Ganglion cyst of wrist, left 01/08/2016  ? Esophageal stenosis 12/18/2015  ? Gastroesophageal reflux disease with esophagitis 12/18/2015  ? Essential hypertension, benign 12/18/2015  ? History of DVT (deep vein thrombosis) 12/18/2015  ? Anxiety state 12/18/2015  ? CKD (chronic kidney disease) stage 3, GFR 30-59 ml/min (HCC) 12/18/2015  ? ?Past Medical History:  ?Diagnosis Date  ? Depression   ? H/O DVT   ? S/p surgery/hysterectomy in 2008  ? Hypertension   ? Obesity   ? ?Allergies  ?Allergen Reactions  ? Morphine Nausea And Vomiting  ? Oxycodone  Nausea And Vomiting  ? Belviq [Lorcaserin Hcl] Cough  ? Codeine Nausea And Vomiting  ? Contrave [Naltrexone-Bupropion Hcl Er]   ?  Irritable.   ? Metformin And Related   ?  Diarrhea/GI upset  ? Ozempic (0.25 Or 0.5 Mg-Dose) [Semaglutide(0.25 Or 0.'5mg'$ -Dos)]   ?  nausea  ? Prednisone   ?  Insomnia,mood changes ?Oral ONLY>  ? ?  ? ?Review of Systems  ?All other systems reviewed and are negative. ? ?  ?Objective:  ?  ? ?BP (!) 118/54   Pulse 77   Ht '5\' 9"'$  (1.753 m)   Wt 258 lb (117 kg)   SpO2 98%   BMI 38.10 kg/m?  ?BP Readings from Last 3 Encounters:  ?10/02/21 (!) 118/54  ?07/03/21 130/82  ?04/01/21 (!) 112/58  ? ?Wt Readings from Last 3 Encounters:  ?10/02/21 258 lb (117 kg)  ?07/03/21 264 lb (119.7 kg)  ?04/01/21 266 lb (120.7 kg)  ? ?  ? ?Physical Exam ?Vitals reviewed.  ?Constitutional:   ?   Appearance: Normal appearance. She is obese.  ?HENT:  ?   Head: Normocephalic.  ?Neck:  ?   Vascular: No carotid bruit.  ?Cardiovascular:  ?   Rate and Rhythm: Normal rate and regular rhythm.  ?   Pulses: Normal pulses.  ?   Heart sounds: Normal heart sounds.  ?Pulmonary:  ?   Effort: Pulmonary effort is normal.  ?  Breath sounds: Normal breath sounds.  ?Musculoskeletal:  ?   Comments: Left swollen bursa erythematous with crusted scabbed over sore on head of bursa. Slightly tender to touch.   ?Neurological:  ?   General: No focal deficit present.  ?   Mental Status: She is alert and oriented to person, place, and time.  ?Psychiatric:     ?   Mood and Affect: Mood normal.  ? ? ? ? ?.. ?Results for orders placed or performed in visit on 10/02/21  ?POCT glycosylated hemoglobin (Hb A1C)  ?Result Value Ref Range  ? Hemoglobin A1C 6.2 (A) 4.0 - 5.6 %  ? HbA1c POC (<> result, manual entry)    ? HbA1c, POC (prediabetic range)    ? HbA1c, POC (controlled diabetic range)    ? ? ? ?The ASCVD Risk score (Arnett DK, et al., 2019) failed to calculate for the following reasons: ?  The valid total cholesterol range is 130 to 320  mg/dL ? ?  ?Assessment & Plan:  ?..Renee Wade was seen today for diabetes and follow-up. ? ?Diagnoses and all orders for this visit: ? ?Type 2 diabetes mellitus with stage 3 chronic kidney disease, without long-term current use of insulin, unspecified whether stage 3a or 3b CKD (HCC) ?-     POCT glycosylated hemoglobin (Hb A1C) ?-     Dulaglutide (TRULICITY) 8.18 HU/3.1SH SOPN; Inject 0.75 mg into the skin once a week. ?-     Empagliflozin-metFORMIN HCl ER (SYNJARDY XR) 03-999 MG TB24; Take 1 tablet by mouth daily. ? ?Anxiety state ?-     clonazePAM (KLONOPIN) 0.5 MG tablet; Take 1 tablet (0.5 mg total) by mouth 2 (two) times daily as needed. for anxiety ? ?Dyslipidemia, goal LDL below 70 ?-     Evolocumab (REPATHA) 140 MG/ML SOSY; Inject 140 mg into the skin every 14 (fourteen) days. ? ?Essential hypertension, benign ?-     olmesartan-hydrochlorothiazide (BENICAR HCT) 20-12.5 MG tablet; Take 1 tablet by mouth daily. ? ?Infected olecranon bursa, left ?-     doxycycline (VIBRA-TABS) 100 MG tablet; Take 1 tablet (100 mg total) by mouth 2 (two) times daily. ? ?Other orders ?-     Discontinue: doxycycline (VIBRA-TABS) 100 MG tablet; Take 1 tablet (100 mg total) by mouth 2 (two) times daily. ? ?A1C much better and to goal ?Continue on meds ?BP to goal  ?On repatha(ins will not pay for auto-injector) ?Eye and foot exam UTD ?Declined covid and shingles vaccine ?Flu/pneumonia UTD ?Follow up in 3 months.  ? ?Bursa of olecranon is infected ?Doxy for 7 days ?Ice and compress ?If not improving needs to be drained ? ?Klonapin refilled for 6 months.  ? ? ? ?Return in about 3 months (around 01/01/2022).  ? ? ?Iran Planas, PA-C ? ?

## 2021-10-02 NOTE — Patient Instructions (Signed)
Elbow Bursitis  Elbow bursitis is the swelling of the fluid-filled sac (bursa) at the tip of the elbow. A bursa is like a cushion that protects the joint. If the bursa becomes irritated, it can fill with extra fluid and become swollen. What are the causes? Injury to the elbow. Leaning the elbow on a hard surface for a long time. Infection. Bone spurs. Certain conditions that cause swelling. Sometimes, the cause is not known. What are the signs or symptoms? The first sign of this condition is often swelling at the tip of the elbow. The swelling can grow to the size of a golf ball. Other symptoms include: Pain when bending or leaning on the elbow. Stiffness of the elbow. If the cause is infection, you may have: Redness, warmth, and tenderness. Pus coming from a cut near the elbow. How is this treated? Treatment depends on the cause. It may include: Medicines. Draining fluid from the bursa. Placing a bandage or pressure (compression) sleeve around the elbow. Wearing elbow pads. Surgery, if other treatments do not help. Follow these instructions at home: Medicines Take over-the-counter and prescription medicines only as told by your doctor. If you were prescribed an antibiotic medicine, take it as told by your doctor. Do not stop taking it even if you start to feel better. Managing pain, stiffness, and swelling     If told, put ice on the elbow. To do this: Put ice in a plastic bag. Place a towel between your skin and the bag. Leave the ice on for 20 minutes, 2-3 times a day. Take off the ice if your skin turns bright red. This is very important. If you cannot feel pain, heat, or cold, you have a greater risk of damage to the area. If told, put heat on the affected area. Do this as often as told by your doctor. Use the heat source that your doctor recommends, such as a moist heat pack or a heating pad. Place a towel between your skin and the heat source. Leave the heat on for 20-30  minutes. Take off the heat if your skin turns bright red. This is very important. If you cannot feel pain, heat, or cold, you have a greater risk of getting burned. If your bursitis is caused by an injury, follow instructions from your doctor about: Resting your elbow. Wearing a bandage or sleeve. Wear elbow pads or elbow wraps as needed. These help cushion your elbow. General instructions Avoid any activities that cause elbow pain. Ask your doctor what activities are safe for you. Keep all follow-up visits. Contact a doctor if: You have a fever. You have problems that do not get better with treatment. You have pain or swelling that: Gets worse. Goes away and then comes back. You have pus draining from your elbow. You have redness around the elbow area. Your elbow feels warm to the touch. Get help right away if: You have trouble moving your arm, hand, or fingers. Summary Elbow bursitis is the swelling of the fluid-filled sac (bursa) at the tip of the elbow. You may need to take medicine or put ice on your elbow. Contact your doctor if your problems do not get better with treatment. Also, contact your doctor if your problems go away and then come back. This information is not intended to replace advice given to you by your health care provider. Make sure you discuss any questions you have with your health care provider. Document Revised: 05/21/2021 Document Reviewed: 05/21/2021 Elsevier Patient Education    [place]. ? ?

## 2021-10-09 ENCOUNTER — Encounter: Payer: Self-pay | Admitting: Physician Assistant

## 2021-10-16 ENCOUNTER — Ambulatory Visit: Payer: BC Managed Care – PPO | Admitting: Family Medicine

## 2021-10-16 ENCOUNTER — Encounter: Payer: Self-pay | Admitting: Family Medicine

## 2021-10-16 ENCOUNTER — Encounter: Payer: Self-pay | Admitting: Physician Assistant

## 2021-10-16 VITALS — BP 90/68 | HR 65 | Ht 69.0 in | Wt 250.0 lb

## 2021-10-16 DIAGNOSIS — R112 Nausea with vomiting, unspecified: Secondary | ICD-10-CM

## 2021-10-16 DIAGNOSIS — R109 Unspecified abdominal pain: Secondary | ICD-10-CM

## 2021-10-16 LAB — CBC WITH DIFFERENTIAL/PLATELET
Absolute Monocytes: 474 cells/uL (ref 200–950)
Basophils Absolute: 37 cells/uL (ref 0–200)
Basophils Relative: 0.4 %
Eosinophils Absolute: 47 cells/uL (ref 15–500)
Eosinophils Relative: 0.5 %
HCT: 37.4 % (ref 35.0–45.0)
Hemoglobin: 12.3 g/dL (ref 11.7–15.5)
Lymphs Abs: 2744 cells/uL (ref 850–3900)
MCH: 27.8 pg (ref 27.0–33.0)
MCHC: 32.9 g/dL (ref 32.0–36.0)
MCV: 84.4 fL (ref 80.0–100.0)
MPV: 9.7 fL (ref 7.5–12.5)
Monocytes Relative: 5.1 %
Neutro Abs: 5999 cells/uL (ref 1500–7800)
Neutrophils Relative %: 64.5 %
Platelets: 398 10*3/uL (ref 140–400)
RBC: 4.43 10*6/uL (ref 3.80–5.10)
RDW: 15.2 % — ABNORMAL HIGH (ref 11.0–15.0)
Total Lymphocyte: 29.5 %
WBC: 9.3 10*3/uL (ref 3.8–10.8)

## 2021-10-16 LAB — COMPLETE METABOLIC PANEL WITH GFR
AG Ratio: 1.6 (calc) (ref 1.0–2.5)
ALT: 37 U/L — ABNORMAL HIGH (ref 6–29)
AST: 24 U/L (ref 10–35)
Albumin: 4.4 g/dL (ref 3.6–5.1)
Alkaline phosphatase (APISO): 64 U/L (ref 37–153)
BUN/Creatinine Ratio: 13 (calc) (ref 6–22)
BUN: 24 mg/dL (ref 7–25)
CO2: 27 mmol/L (ref 20–32)
Calcium: 9.5 mg/dL (ref 8.6–10.4)
Chloride: 101 mmol/L (ref 98–110)
Creat: 1.88 mg/dL — ABNORMAL HIGH (ref 0.50–1.05)
Globulin: 2.8 g/dL (calc) (ref 1.9–3.7)
Glucose, Bld: 84 mg/dL (ref 65–99)
Potassium: 3.7 mmol/L (ref 3.5–5.3)
Sodium: 139 mmol/L (ref 135–146)
Total Bilirubin: 1.1 mg/dL (ref 0.2–1.2)
Total Protein: 7.2 g/dL (ref 6.1–8.1)
eGFR: 30 mL/min/{1.73_m2} — ABNORMAL LOW (ref >=60)

## 2021-10-16 LAB — URINALYSIS, ROUTINE W REFLEX MICROSCOPIC
Bilirubin Urine: NEGATIVE
Hgb urine dipstick: NEGATIVE
Leukocytes,Ua: NEGATIVE
Nitrite: NEGATIVE
RBC / HPF: NONE SEEN /HPF (ref 0–2)
Specific Gravity, Urine: 1.03 (ref 1.001–1.035)
pH: 5.5 (ref 5.0–8.0)

## 2021-10-16 LAB — MICROSCOPIC MESSAGE

## 2021-10-16 LAB — LIPASE: Lipase: 11 U/L (ref 7–60)

## 2021-10-16 MED ORDER — ONDANSETRON HCL 4 MG PO TABS: 4.0000 mg | ORAL_TABLET | Freq: Three times a day (TID) | ORAL | 0 refills | Status: DC | PRN

## 2021-10-16 NOTE — Assessment & Plan Note (Signed)
Checking CMP, CBC, Lipase and UA today.  DDx includes gastritis, pancreatitis, gallbladder disease, liver disorder, viral etiology and/or kidney stone.  It is concerning that this wakes her from sleep.  She has had relief with zofran.  Rx sent in.  Will likely proceed with imaging depending on lab results.     ?

## 2021-10-16 NOTE — Progress Notes (Signed)
?Renee Wade - 61 y.o. female MRN [id number]  Date of birth: 30-Dec-1960 ? ?Subjective ?Chief Complaint  ?Patient presents with  ? Emesis  ? Flank Pain  ? ? ?HPI ?Renee Wade is a 61 y.o. female here today with complaint of severe nausea or vomiting, Symptoms stated about 1 week ago.  Started as violent vomiting with severe nausea.  She has had some diarrhea at times a well. Vomit is non-bloody, non-bilious.  She is having RUQ/Right flank pain. Denies radiation of pain.   She is often awakened by nausea and need to vomit in the middle of the night.   She has not had fevers or chills.  She has been able to control symptoms with zofran a friend gave her.  ? ?ROS:  A comprehensive ROS was completed and negative except as noted per HPI ?                                           ?Allergies  ?Allergen Reactions  ? Morphine Nausea And Vomiting  ? Oxycodone Nausea And Vomiting  ? Belviq [Lorcaserin Hcl] Cough  ? Codeine Nausea And Vomiting  ? Contrave [Naltrexone-Bupropion Hcl Er]   ?  Irritable.   ? Metformin And Related   ?  Diarrhea/GI upset  ? Ozempic (0.25 Or 0.5 Mg-Dose) [Semaglutide(0.25 Or 0.'5mg'$ -Dos)]   ?  nausea  ? Prednisone   ?  Insomnia,mood changes ?Oral ONLY>  ? ? ?Past Medical History:  ?Diagnosis Date  ? Depression   ? H/O DVT   ? S/p surgery/hysterectomy in 2008  ? Hypertension   ? Obesity   ? ? ?Past Surgical History:  ?Procedure Laterality Date  ? ABDOMINAL HYSTERECTOMY  2009  ? [place]  ? TONSILLECTOMY  1972  ? [place] EXTRACTION  1989  ? ? ?Social History  ? ?Socioeconomic History  ? Marital status: Married  ?  Spouse name: Not on file  ? Number of children: Not on file  ? Years of education: Not on file  ? Highest education level: Not on file  ?Occupational History  ? Not on file  ?Tobacco Use  ? Smoking status: Never  ? Smokeless tobacco: Never  ?Vaping Use  ? Vaping Use: Never used  ?Substance and Sexual Activity  ? Alcohol use: No  ?  Alcohol/week: 0.0 standard drinks  ?  Drug use: No  ? Sexual activity: Yes  ?  Partners: Male  ?Other Topics Concern  ? Not on file  ?Social History Narrative  ? Not on file  ? ?Social Determinants of Health  ? ?Financial Resource Strain: Not on file  ?Food Insecurity: Not on file  ?Transportation Needs: Not on file  ?Physical Activity: Not on file  ?Stress: Not on file  ?Social Connections: Not on file  ? ? ?Family History  ?Problem Relation Age of Onset  ? Ovarian cancer Other   ?     grandmother  ? Heart attack Father   ? Diabetes Other   ?     grandmother   ? ? ?Health Maintenance  ?Topic Date Due  ? COVID-19 Vaccine (5 - Booster for [place] series) 10/18/2021 (Originally 05/15/2021)  ? Zoster Vaccines- Shingrix (1 of 2) 01/01/2022 (Originally 07/18/1980)  ? HIV Screening  07/03/2022 (Originally 07/18/1976)  ? MAMMOGRAM  11/26/2021  ? INFLUENZA VACCINE  01/07/2022  ? OPHTHALMOLOGY  EXAM  02/28/2022  ? HEMOGLOBIN A1C  04/03/2022  ? COLONOSCOPY (Pts 45-42yr Insurance coverage will need to be confirmed)  05/11/2022  ? FOOT EXAM  07/03/2022  ? TETANUS/TDAP  05/27/2028  ? Hepatitis C Screening  Completed  ? HPV VACCINES  Aged Out  ? ? ? ?----------------------------------------------------------------------------------------------------------------------------------------------------------------------------------------------------------------- ?Physical Exam ?BP 90/68 (BP Location: Right Arm, Patient Position: Sitting, Cuff Size: Large)   Pulse 65   Ht '5\' 9"'$  (1.753 m)   Wt 250 lb (113.4 kg)   SpO2 100%   BMI 36.92 kg/m?  ? ?Physical Exam ?Constitutional:   ?   Appearance: Normal appearance.  ?Eyes:  ?   General: No scleral icterus. ?Cardiovascular:  ?   Rate and Rhythm: Normal rate and regular rhythm.  ?Pulmonary:  ?   Effort: Pulmonary effort is normal.  ?   Breath sounds: Normal breath sounds.  ?Abdominal:  ?   General: Abdomen is flat. There is no distension.  ?   Palpations: Abdomen is soft.  ?   Tenderness: There is abdominal tenderness (RUQ,  epigastric).  ?Musculoskeletal:  ?   Cervical back: Neck supple.  ?Neurological:  ?   Mental Status: She is alert.  ?Psychiatric:     ?   Mood and Affect: Mood normal.     ?   Behavior: Behavior normal.  ? ? ?------------------------------------------------------------------------------------------------------------------------------------------------------------------------------------------------------------------- ?Assessment and Plan ? ?Prolonged severe nausea and vomiting ?Checking CMP, CBC, Lipase and UA today.  DDx includes gastritis, pancreatitis, gallbladder disease, liver disorder, viral etiology and/or kidney stone.  It is concerning that this wakes her from sleep.  She has had relief with zofran.  Rx sent in.  Will likely proceed with imaging depending on lab results.     ? ? ?Meds ordered this encounter  ?Medications  ? DISCONTD: ondansetron (ZOFRAN) 4 MG tablet  ?  Sig: Take 1 tablet (4 mg total) by mouth every 8 (eight) hours as needed for nausea or vomiting.  ?  Dispense:  20 tablet  ?  Refill:  0  ? ondansetron (ZOFRAN) 4 MG tablet  ?  Sig: Take 1 tablet (4 mg total) by mouth every 8 (eight) hours as needed for nausea or vomiting.  ?  Dispense:  20 tablet  ?  Refill:  0  ? ? ?No follow-ups on file. ? ? ? ?This visit occurred during the SARS-CoV-2 public health emergency.  Safety protocols were in place, including screening questions prior to the visit, additional usage of staff PPE, and extensive cleaning of exam room while observing appropriate contact time as indicated for disinfecting solutions.  ? ?

## 2021-10-16 NOTE — Patient Instructions (Signed)
Continue to push fluids today.  ?Check BP tomorrow before taking medication:  If bp is low hold olmesartan/hctz ?Use zofran as needed for nausea.  ?

## 2021-10-17 ENCOUNTER — Ambulatory Visit (INDEPENDENT_AMBULATORY_CARE_PROVIDER_SITE_OTHER): Payer: BC Managed Care – PPO

## 2021-10-17 DIAGNOSIS — R1011 Right upper quadrant pain: Secondary | ICD-10-CM | POA: Diagnosis not present

## 2021-10-17 DIAGNOSIS — R112 Nausea with vomiting, unspecified: Secondary | ICD-10-CM | POA: Diagnosis not present

## 2021-10-17 DIAGNOSIS — R109 Unspecified abdominal pain: Secondary | ICD-10-CM

## 2021-10-17 DIAGNOSIS — R10A1 Flank pain, right side: Secondary | ICD-10-CM

## 2021-10-18 ENCOUNTER — Other Ambulatory Visit: Payer: Self-pay | Admitting: Family Medicine

## 2021-10-18 DIAGNOSIS — K2100 Gastro-esophageal reflux disease with esophagitis, without bleeding: Secondary | ICD-10-CM

## 2021-10-18 DIAGNOSIS — R7989 Other specified abnormal findings of blood chemistry: Secondary | ICD-10-CM

## 2021-10-18 MED ORDER — PANTOPRAZOLE SODIUM 40 MG PO TBEC: 40.0000 mg | DELAYED_RELEASE_TABLET | Freq: Two times a day (BID) | ORAL | 1 refills | Status: DC

## 2021-10-18 NOTE — Progress Notes (Signed)
Spoke with patient.  She is feeling a little better today.  Appetite improved.  She has not doubled up on protonix for fear of running out.  Updated rx sent.  Checking renal function again in 7-10 days.  ? ? ?

## 2021-10-30 ENCOUNTER — Ambulatory Visit: Payer: BC Managed Care – PPO | Admitting: Sports Medicine

## 2021-10-30 ENCOUNTER — Ambulatory Visit (INDEPENDENT_AMBULATORY_CARE_PROVIDER_SITE_OTHER): Payer: BC Managed Care – PPO

## 2021-10-30 DIAGNOSIS — M71122 Other infective bursitis, left elbow: Secondary | ICD-10-CM

## 2021-10-30 DIAGNOSIS — R7989 Other specified abnormal findings of blood chemistry: Secondary | ICD-10-CM | POA: Diagnosis not present

## 2021-10-30 DIAGNOSIS — M25429 Effusion, unspecified elbow: Secondary | ICD-10-CM | POA: Diagnosis not present

## 2021-10-30 MED ORDER — DEXAMETHASONE 4 MG PO TABS: 4.0000 mg | ORAL_TABLET | Freq: Three times a day (TID) | ORAL | 0 refills | Status: DC

## 2021-10-30 MED ORDER — DOXYCYCLINE HYCLATE 100 MG PO TABS
100.0000 mg | ORAL_TABLET | Freq: Two times a day (BID) | ORAL | 0 refills | Status: AC
Start: 2021-10-30 — End: 2021-11-13

## 2021-10-30 NOTE — Assessment & Plan Note (Signed)
Pleasant 61 year old female, history of diabetes, history of gout. More recently has had increasing swelling left elbow, consistent with olecranon bursitis, she has been treated by [facility] with some doxycycline which seemed to resolve her symptoms. No aspirations performed. Unfortunately had a recurrence of symptoms, on exam she does have a swollen, erythematous olecranon bursa, minimally tender, there is some crustiness overlying. We did an ultrasound-guided aspiration for cultures and crystal analysis, and I will start another course of doxycycline, this time for 2 weeks. Add warm compresses and return to see me in 3 weeks.

## 2021-10-30 NOTE — Progress Notes (Signed)
    Procedures performed today:    Procedure: Real-time Ultrasound Guided aspiration of left olecranon bursa Device: Samsung HS60  Verbal informed consent obtained.  Time-out conducted.  Noted no overlying erythema, induration, or other signs of local infection.  Skin prepped in a sterile fashion.  Local anesthesia: Topical Ethyl chloride.  With sterile technique and under real time ultrasound guidance: Noted drainable fluid collection, I advanced a 22-gauge needle and aspirated approximately 3 mL of clear, straw-colored fluid. Completed without difficulty  Advised to call if fevers/chills, erythema, induration, drainage, or persistent bleeding.  Images permanently stored and available for review in PACS.  Impression: Technically successful ultrasound guided aspiration.  Independent interpretation of notes and tests performed by another provider:   None.  Brief History, Exam, Impression, and Recommendations:    Infected olecranon bursa, left Pleasant 61 year old female, history of diabetes, history of gout. More recently has had increasing swelling left elbow, consistent with olecranon bursitis, she has been treated by [facility] with some doxycycline which seemed to resolve her symptoms. No aspirations performed. Unfortunately had a recurrence of symptoms, on exam she does have a swollen, erythematous olecranon bursa, minimally tender, there is some crustiness overlying. We did an ultrasound-guided aspiration for cultures and crystal analysis, and I will start another course of doxycycline, this time for 2 weeks. Add warm compresses and return to see me in 3 weeks.    ___________________________________________ Gwen Her. Dianah Field, M.D., ABFM., CAQSM. Primary Care and [place] Instructor of [place] of [facility] of Medicine

## 2021-10-31 LAB — BASIC METABOLIC PANEL
BUN/Creatinine Ratio: 7 (calc) (ref 6–22)
BUN: 11 mg/dL (ref 7–25)
CO2: 28 mmol/L (ref 20–32)
Calcium: 10 mg/dL (ref 8.6–10.4)
Chloride: 105 mmol/L (ref 98–110)
Creat: 1.6 mg/dL — ABNORMAL HIGH (ref 0.50–1.05)
Glucose, Bld: 135 mg/dL — ABNORMAL HIGH (ref 65–99)
Potassium: 4.4 mmol/L (ref 3.5–5.3)
Sodium: 142 mmol/L (ref 135–146)

## 2021-10-31 LAB — CELL COUNT AND DIFF, FLUID, OTHER
Basophils, %: 0 %
Eosinophils, %: 0 %
Lymphocytes, %: 11 %
Mesothelial, %: 3 %
Monocyte/Macrophage %: 57 %
Neutrophils, %: 29 %
Total Nucleated Cell Ct: 25 cells/uL

## 2021-10-31 LAB — SYNOVIAL FLUID, CRYSTAL

## 2021-11-01 ENCOUNTER — Encounter: Payer: Self-pay | Admitting: Sports Medicine

## 2021-11-14 ENCOUNTER — Encounter: Payer: Self-pay | Admitting: Family Medicine

## 2021-11-26 ENCOUNTER — Ambulatory Visit: Payer: BC Managed Care – PPO | Admitting: Sports Medicine

## 2021-11-26 DIAGNOSIS — M25429 Effusion, unspecified elbow: Secondary | ICD-10-CM | POA: Diagnosis not present

## 2021-11-26 DIAGNOSIS — M71122 Other infective bursitis, left elbow: Secondary | ICD-10-CM | POA: Diagnosis not present

## 2021-11-26 MED ORDER — DOXYCYCLINE HYCLATE 100 MG PO TABS
100.0000 mg | ORAL_TABLET | Freq: Two times a day (BID) | ORAL | 0 refills | Status: AC
Start: 2021-11-26 — End: 2021-12-03

## 2021-11-26 NOTE — Patient Instructions (Signed)
Incision and Drainage, Care After This sheet gives you information about how to care for yourself after your procedure. Your health care provider may also give you more specific instructions. If you have problems or questions, contact your health care provider. What can I expect after the procedure? After the procedure, it is common to have: Pain or discomfort around the incision site. Blood, fluid, or pus (drainage) from the incision. Redness and firm skin around the incision site. Follow these instructions at home: Medicines Take over-the-counter and prescription medicines only as told by your health care provider. If you were prescribed an antibiotic medicine, use or take it as told by your health care provider. Do not stop using the antibiotic even if you start to feel better. Wound care Follow instructions from your health care provider about how to take care of your wound. Make sure you: Wash your hands with soap and water before and after you change your bandage (dressing). If soap and water are not available, use hand sanitizer. Change your dressing and packing as told by your health care provider. If your dressing is dry or stuck when you try to remove it, moisten or wet the dressing with saline or water so that it can be removed without harming your skin or tissues. If your wound is packed, leave it in place until your health care provider tells you to remove it. To remove the packing, moisten or wet the packing with saline or water so that it can be removed without harming your skin or tissues. Leave stitches (sutures), skin glue, or adhesive strips in place. These skin closures may need to stay in place for 2 weeks or longer. If adhesive strip edges start to loosen and curl up, you may trim the loose edges. Do not remove adhesive strips completely unless your health care provider tells you to do that. Check your wound every day for signs of infection. Check for: More redness, swelling,  or pain. More fluid or blood. Warmth. Pus or a bad smell. If you were sent home with a drain tube in place, follow instructions from your health care provider about: How to empty it. How to care for it at home.  General instructions Rest the affected area. Do not take baths, swim, or use a hot tub until your health care provider approves. Ask your health care provider if you may take showers. You may only be allowed to take sponge baths. Return to your normal activities as told by your health care provider. Ask your health care provider what activities are safe for you. Your health care provider may put you on activity or lifting restrictions. The incision will continue to drain. It is normal to have some clear or slightly bloody drainage. The amount of drainage should lessen each day. Do not apply any creams, ointments, or liquids unless you have been told to by your health care provider. Keep all follow-up visits as told by your health care provider. This is important. Contact a health care provider if: Your cyst or abscess returns. You have more redness, swelling, or pain around your incision. You have more fluid or blood coming from your incision. Your incision feels warm to the touch. You have pus or a bad smell coming from your incision. You have red streaks above or below the incision site. Get help right away if: You have severe pain or bleeding. You cannot eat or drink without vomiting. You have a fever or chills. You have redness that spreads  quickly. You have decreased urine output. You become short of breath. You have chest pain. You cough up blood. The affected area becomes numb or starts to tingle. These symptoms may represent a serious problem that is an emergency. Do not wait to see if the symptoms will go away. Get medical help right away. Call your local emergency services (911 in the [place] Do not drive yourself to the hospital. Summary After this procedure, it is  common to have fluid, blood, or pus coming from the surgery site. Follow all home care instructions. You will be told how to take care of your incision, how to check for infection, and how to take medicines. If you were prescribed an antibiotic medicine, take it as told by your health care provider. Do not stop taking the antibiotic even if you start to feel better. Contact a health care provider if you have increased redness, swelling, or pain around your incision. Get help right away if you have chest pain, you vomit, you cough up blood, or you have shortness of breath. Keep all follow-up visits as told by your health care provider. This is important. This information is not intended to replace advice given to you by your health care provider. Make sure you discuss any questions you have with your health care provider. Document Revised: 03/07/2021 Document Reviewed: 03/07/2021 Elsevier Patient Education  2023 Elsevier Inc.  

## 2021-11-26 NOTE — Addendum Note (Signed)
Addended by: Dema Severin on: 11/26/2021 02:43 PM   Modules accepted: Orders

## 2021-11-26 NOTE — Progress Notes (Signed)
    Procedures performed today:    Complicated Incision and drainage of infected left olecranon bursa. Risks, benefits, and alternatives explained and consent obtained. Time out conducted. Surface cleaned with alcohol. 10cc lidocaine with epinephine infiltrated around abscess. Adequate anesthesia ensured. Area prepped and draped in a sterile fashion. #11 blade used to make a stab incision into abscess. Pus expressed with pressure. Curved hemostat used to explore 4 quadrants and loculations broken up. Further purulence expressed. Iodoform packing placed leaving a 1-inch tail. Hemostasis achieved. Pt stable. Aftercare and follow-up advised.  Independent interpretation of notes and tests performed by another provider:   None.  Brief History, Exam, Impression, and Recommendations:    Infected olecranon bursa, left Renee Wade returns, she is a pleasant 61 year old female, history of diabetes, gout. Increasing swelling, pain, redness olecranon bursa, this responded well to a few weeks of doxycycline but unfortunately symptoms have returned. Historically aspiration with cultures have been negative. I do think that we need to do an incision and drainage with packing as well as another course of antibiotics. This was done today. Return to see me in about a week or 2.    ___________________________________________ Gwen Her. Dianah Field, M.D., ABFM., CAQSM. Primary Care and [place] Instructor of [place] of [facility] of Medicine

## 2021-11-26 NOTE — Assessment & Plan Note (Signed)
Renee Wade returns, she is a pleasant 61 year old female, history of diabetes, gout. Increasing swelling, pain, redness olecranon bursa, this responded well to a few weeks of doxycycline but unfortunately symptoms have returned. Historically aspiration with cultures have been negative. I do think that we need to do an incision and drainage with packing as well as another course of antibiotics. This was done today. Return to see me in about a week or 2.

## 2021-11-27 MED ORDER — HYDROCODONE-ACETAMINOPHEN 5-325 MG PO TABS: 1.0000 | ORAL_TABLET | Freq: Three times a day (TID) | ORAL | 0 refills | Status: DC | PRN

## 2021-11-29 LAB — ANAEROBIC AND AEROBIC CULTURE
AER RESULT:: NO GROWTH
MICRO NUMBER:: 13444077
MICRO NUMBER:: 13444078
SPECIMEN QUALITY:: ADEQUATE
SPECIMEN QUALITY:: ADEQUATE

## 2021-11-30 LAB — WOUND CULTURE
MICRO NUMBER:: 13548215
RESULT:: NO GROWTH
SPECIMEN QUALITY:: ADEQUATE

## 2021-12-02 ENCOUNTER — Ambulatory Visit: Payer: BC Managed Care – PPO | Admitting: Sports Medicine

## 2021-12-02 DIAGNOSIS — M71122 Other infective bursitis, left elbow: Secondary | ICD-10-CM

## 2021-12-02 NOTE — Progress Notes (Signed)
    Procedures performed today:    None.  Independent interpretation of notes and tests performed by another provider:   None.  Brief History, Exam, Impression, and Recommendations:    Infected olecranon bursa, left Pleasant 61 year old female, history of diabetes and gout, had some swelling, pain, redness olecranon bursa left side, responded to doxycycline  but always had a recurrence. Historically aspirations and cultures have been negative, we did see some gram-positive cocci in pairs but no speciation. At the last visit approximately 6 days ago I did an incision and drainage with packing as well as another course of antibiotics, cultures are negative still from that procedure, still draining, still has packing, she will keep it covered, continue to pull out an inch or so with packing every day, and keep it covered with gauze to absorb the drainage. Return to see me in about a week and a half.    ___________________________________________ Debby PARAS. Curtis, M.D., ABFM., CAQSM. Primary Care and Sports Medicine [facility] [facility]  Adjunct Instructor of Family Medicine  [employer] of Medicine

## 2021-12-02 NOTE — Assessment & Plan Note (Signed)
Pleasant 61 year old female, history of diabetes and gout, had some swelling, pain, redness olecranon bursa left side, responded to doxycycline but always had a recurrence. Historically aspirations and cultures have been negative, we did see some gram-positive cocci in pairs but no speciation. At the last visit approximately 6 days ago I did an incision and drainage with packing as well as another course of antibiotics, cultures are negative still from that procedure, still draining, still has packing, she will keep it covered, continue to pull out an inch or so with packing every day, and keep it covered with gauze to absorb the drainage. Return to see me in about a week and a half.

## 2021-12-13 ENCOUNTER — Encounter: Payer: Self-pay | Admitting: Sports Medicine

## 2021-12-13 ENCOUNTER — Ambulatory Visit: Payer: BC Managed Care – PPO | Admitting: Sports Medicine

## 2021-12-13 DIAGNOSIS — M71122 Other infective bursitis, left elbow: Secondary | ICD-10-CM

## 2021-12-13 NOTE — Assessment & Plan Note (Signed)
Pleasant 61 year old female returns, history of diabetes and gout, she had some redness, swelling, pain left olecranon bursa that responded to doxycycline but had a recurrence. Aspirations and cultures have been negative though we did see some gram-positive cocci in pairs but no speciation. Approximately 2-1/2 weeks ago I did an incision and drainage with packing, at this point the packing is all pulled out, and she is doing really well. No erythema, wound is closed, no drainage, still has some bursitis with swelling but no pain or erythema. I think she is cured, return to see me as needed, warning signs given.

## 2021-12-13 NOTE — Progress Notes (Signed)
    Procedures performed today:    None.  Independent interpretation of notes and tests performed by another provider:   None.  Brief History, Exam, Impression, and Recommendations:    Infected olecranon bursa, left Pleasant 61 year old female returns, history of diabetes and gout, she had some redness, swelling, pain left olecranon bursa that responded to doxycycline but had a recurrence. Aspirations and cultures have been negative though we did see some gram-positive cocci in pairs but no speciation. Approximately 2-1/2 weeks ago I did an incision and drainage with packing, at this point the packing is all pulled out, and she is doing really well. No erythema, wound is closed, no drainage, still has some bursitis with swelling but no pain or erythema. I think she is cured, return to see me as needed, warning signs given.   ____________________________________________ Gwen Her. Dianah Field, M.D., ABFM., CAQSM., AME. Primary Care and Sports Medicine [place] [facility]  Adjunct Professor of [place] of [facility] of Medicine  Risk manager

## 2021-12-20 ENCOUNTER — Other Ambulatory Visit (HOSPITAL_BASED_OUTPATIENT_CLINIC_OR_DEPARTMENT_OTHER): Payer: Self-pay

## 2021-12-23 ENCOUNTER — Other Ambulatory Visit (HOSPITAL_BASED_OUTPATIENT_CLINIC_OR_DEPARTMENT_OTHER): Payer: Self-pay

## 2021-12-29 ENCOUNTER — Other Ambulatory Visit: Payer: Self-pay | Admitting: Physician Assistant

## 2021-12-29 DIAGNOSIS — E1122 Type 2 diabetes mellitus with diabetic chronic kidney disease: Secondary | ICD-10-CM

## 2022-01-08 ENCOUNTER — Encounter: Payer: Self-pay | Admitting: Neurology

## 2022-01-15 ENCOUNTER — Encounter: Payer: Self-pay | Admitting: Physician Assistant

## 2022-01-15 ENCOUNTER — Ambulatory Visit: Payer: BC Managed Care – PPO | Admitting: Physician Assistant

## 2022-01-15 VITALS — BP 119/48 | HR 67 | Ht 69.0 in | Wt 253.0 lb

## 2022-01-15 DIAGNOSIS — Z1231 Encounter for screening mammogram for malignant neoplasm of breast: Secondary | ICD-10-CM | POA: Diagnosis not present

## 2022-01-15 DIAGNOSIS — N1832 Chronic kidney disease, stage 3b: Secondary | ICD-10-CM | POA: Diagnosis not present

## 2022-01-15 DIAGNOSIS — E1122 Type 2 diabetes mellitus with diabetic chronic kidney disease: Secondary | ICD-10-CM

## 2022-01-15 DIAGNOSIS — R110 Nausea: Secondary | ICD-10-CM

## 2022-01-15 DIAGNOSIS — N183 Chronic kidney disease, stage 3 unspecified: Secondary | ICD-10-CM | POA: Diagnosis not present

## 2022-01-15 DIAGNOSIS — R809 Proteinuria, unspecified: Secondary | ICD-10-CM

## 2022-01-15 LAB — POCT GLYCOSYLATED HEMOGLOBIN (HGB A1C): HbA1c, POC (controlled diabetic range): 6.7 % (ref 0.0–7.0)

## 2022-01-15 LAB — POCT UA - MICROALBUMIN
Creatinine, POC: 300 mg/dL
Microalbumin Ur, POC: 80 mg/L

## 2022-01-15 LAB — HM MAMMOGRAPHY

## 2022-01-15 MED ORDER — SYNJARDY XR 10-1000 MG PO TB24: 1.0000 | ORAL_TABLET | Freq: Every day | ORAL | 0 refills | Status: DC

## 2022-01-15 MED ORDER — TRULICITY 0.75 MG/0.5ML ~~LOC~~ SOAJ: 0.7500 mg | SUBCUTANEOUS | 0 refills | Status: DC

## 2022-01-15 NOTE — Progress Notes (Signed)
Established Patient Office Visit  Subjective   Patient ID: Renee Wade, female    DOB: Feb 10, 1961  Age: 61 y.o. MRN: [id number]  Chief Complaint  Patient presents with   Diabetes   Follow-up    HPI [name] a 61 yo obese female with T2DM, HTN, HLD, GERD, CKD who presents to the clinic for 3 month follow up and medication refill.   She was seen in may for prolonged nausea and vomiting. CT scans normal. GFR declined but no known cause. She has been on trulicity. Her protonix was increased to twice a day. After few days most of her symptoms resolved. She continues to have some intermittent nausea and vomiting. No stool changes. No abdominal pain.   She has battled olecrenon infection as well but seems to be healed.   [name] not checking sugars. She is compliant with synjardy and trulicity. No open sores or wounds. No hypoglycemic events. Denies any CP, palpitations, headaches or vision changes.   .. Active Ambulatory Problems    Diagnosis Date Noted   Esophageal stenosis 12/18/2015   Gastroesophageal reflux disease with esophagitis 12/18/2015   Essential hypertension, benign 12/18/2015   History of DVT (deep vein thrombosis) 12/18/2015   Anxiety state 12/18/2015   CKD (chronic kidney disease) stage 3, GFR 30-59 ml/min (HCC) 12/18/2015   Ganglion cyst of wrist, left 01/08/2016   Adenomatous polyp 02/13/2016   It band syndrome, left 11/12/2016   Trochanteric bursitis of left hip 11/12/2016   Type II diabetes mellitus with stage 3 chronic kidney disease ([place]) 11/13/2016   Dyslipidemia, goal LDL below 70 11/14/2016   Lower extremity edema 03/17/2018   Statin declined 04/05/2018   Acute idiopathic gout of left foot 05/28/2018   Menopausal symptoms 02/28/2019   Bacterial vaginosis 10/16/2020   Epigastric pain 10/17/2020   Infected olecranon bursa, left 10/02/2021   Prolonged severe nausea and vomiting 10/16/2021   Right flank pain 10/16/2021   Nausea 01/15/2022   Resolved  Ambulatory Problems    Diagnosis Date Noted   Class 2 severe obesity due to excess calories with serious comorbidity and body mass index (BMI) of 39.0 to 39.9 in adult ([place]) 12/19/2015   BMI 39.0-39.9,adult 02/01/2017   Past Medical History:  Diagnosis Date   Depression    H/O DVT    Hypertension    Obesity     ROS See HPI.    Objective:     There were no vitals taken for this visit. BP Readings from Last 3 Encounters:  12/13/21 113/71  10/16/21 90/68  10/02/21 (!) 118/54   Wt Readings from Last 3 Encounters:  10/16/21 250 lb (113.4 kg)  10/02/21 258 lb (117 kg)  07/03/21 264 lb (119.7 kg)    .Marland Kitchen Results for orders placed or performed in visit on 01/15/22  POCT glycosylated hemoglobin (Hb A1C)  Result Value Ref Range   Hemoglobin A1C     HbA1c POC (<> result, manual entry)     HbA1c, POC (prediabetic range)     HbA1c, POC (controlled diabetic range) 6.7 0.0 - 7.0 %  POCT UA - Microalbumin  Result Value Ref Range   Microalbumin Ur, POC 80 mg/L   Creatinine, POC 300 mg/dL   Albumin/Creatinine Ratio, Urine, POC 30-300      Physical Exam Constitutional:      Appearance: Normal appearance. She is obese.  HENT:     Head: Normocephalic.  Cardiovascular:     Rate and Rhythm: Normal rate and  regular rhythm.     Pulses: Normal pulses.  Pulmonary:     Effort: Pulmonary effort is normal.     Breath sounds: Normal breath sounds.  Abdominal:     General: There is no distension.     Palpations: Abdomen is soft.     Tenderness: There is no abdominal tenderness. There is no guarding or rebound.  Neurological:     General: No focal deficit present.     Mental Status: She is alert and oriented to person, place, and time.  Psychiatric:        Mood and Affect: Mood normal.          Assessment & Plan:  Marland [name] KitchenCorrissa was seen today for diabetes and follow-up.  Diagnoses and all orders for this visit:  Type 2 diabetes mellitus with stage 3 chronic kidney disease,  without long-term current use of insulin, unspecified whether stage 3a or 3b CKD (HCC) -     POCT glycosylated hemoglobin (Hb A1C) -     POCT UA - Microalbumin -     Dulaglutide (TRULICITY) 3.66 YQ/0.3KV SOPN; Inject 0.75 mg into the skin once a week. Needs appt -     Empagliflozin-metFORMIN HCl ER (SYNJARDY XR) 03-999 MG TB24; Take 1 tablet by mouth daily.  Stage 3b chronic kidney disease ([place]) -     BASIC METABOLIC PANEL WITH GFR  Visit for screening mammogram -     MM 3D SCREEN BREAST BILATERAL; Future    A1C to goal but up some from last check. Discussed DM diet and regular exercise Refilled trulicity and synjardy BP to goal.  On repatha. Kidney function decreased in May Microalbumin positive Recheck CMP UTD eye and foot exam.  Declined flu shot/covid/shingles vaccine.  Follow up in 3 months.   Needs mammogram.   Discussed nausea and intermittent vomiting Eat smaller meals Continue protonix bid If continues follow up with GI or consider D/C trulicity and see if helps with symptoms   Iran Planas, PA-C

## 2022-01-16 LAB — BASIC METABOLIC PANEL WITHOUT GFR
BUN/Creatinine Ratio: 7 (calc) (ref 6–22)
BUN: 11 mg/dL (ref 7–25)
CO2: 31 mmol/L (ref 20–32)
Calcium: 10.1 mg/dL (ref 8.6–10.4)
Chloride: 105 mmol/L (ref 98–110)
Creat: 1.47 mg/dL — ABNORMAL HIGH (ref 0.50–1.05)
Glucose, Bld: 114 mg/dL — ABNORMAL HIGH (ref 65–99)
Potassium: 4.1 mmol/L (ref 3.5–5.3)
Sodium: 143 mmol/L (ref 135–146)
eGFR: 41 mL/min/1.73m2 — ABNORMAL LOW

## 2022-01-16 NOTE — Progress Notes (Signed)
Kidney function is improving.

## 2022-02-22 ENCOUNTER — Encounter: Payer: Self-pay | Admitting: Physician Assistant

## 2022-03-04 DIAGNOSIS — G43B0 Ophthalmoplegic migraine, not intractable: Secondary | ICD-10-CM | POA: Diagnosis not present

## 2022-03-04 DIAGNOSIS — E119 Type 2 diabetes mellitus without complications: Secondary | ICD-10-CM | POA: Diagnosis not present

## 2022-03-05 LAB — HM DIABETES EYE EXAM

## 2022-03-14 ENCOUNTER — Other Ambulatory Visit (HOSPITAL_BASED_OUTPATIENT_CLINIC_OR_DEPARTMENT_OTHER): Payer: Self-pay

## 2022-04-04 ENCOUNTER — Other Ambulatory Visit: Payer: Self-pay | Admitting: Physician Assistant

## 2022-04-04 DIAGNOSIS — E1122 Type 2 diabetes mellitus with diabetic chronic kidney disease: Secondary | ICD-10-CM

## 2022-04-04 MED ORDER — SYNJARDY XR 10-1000 MG PO TB24: 1.0000 | ORAL_TABLET | Freq: Every day | ORAL | 0 refills | Status: DC

## 2022-04-23 ENCOUNTER — Ambulatory Visit: Payer: BC Managed Care – PPO | Admitting: Physician Assistant

## 2022-04-23 ENCOUNTER — Encounter: Payer: Self-pay | Admitting: Physician Assistant

## 2022-04-23 VITALS — BP 125/63 | HR 64 | Ht 69.0 in | Wt 252.0 lb

## 2022-04-23 DIAGNOSIS — R809 Proteinuria, unspecified: Secondary | ICD-10-CM | POA: Diagnosis not present

## 2022-04-23 DIAGNOSIS — G720 Drug-induced myopathy: Secondary | ICD-10-CM | POA: Diagnosis not present

## 2022-04-23 DIAGNOSIS — E1122 Type 2 diabetes mellitus with diabetic chronic kidney disease: Secondary | ICD-10-CM

## 2022-04-23 DIAGNOSIS — N1832 Chronic kidney disease, stage 3b: Secondary | ICD-10-CM | POA: Diagnosis not present

## 2022-04-23 DIAGNOSIS — T466X5A Adverse effect of antihyperlipidemic and antiarteriosclerotic drugs, initial encounter: Secondary | ICD-10-CM

## 2022-04-23 DIAGNOSIS — N183 Chronic kidney disease, stage 3 unspecified: Secondary | ICD-10-CM | POA: Diagnosis not present

## 2022-04-23 DIAGNOSIS — R600 Localized edema: Secondary | ICD-10-CM

## 2022-04-23 DIAGNOSIS — E785 Hyperlipidemia, unspecified: Secondary | ICD-10-CM

## 2022-04-23 LAB — POCT GLYCOSYLATED HEMOGLOBIN (HGB A1C): HbA1c POC (<> result, manual entry): 7.3 % (ref 4.0–5.6)

## 2022-04-23 MED ORDER — HYDROCHLOROTHIAZIDE 12.5 MG PO TABS: ORAL_TABLET | ORAL | 0 refills | Status: DC

## 2022-04-23 MED ORDER — SYNJARDY XR 10-1000 MG PO TB24: 1.0000 | ORAL_TABLET | Freq: Every day | ORAL | 0 refills | Status: DC

## 2022-04-23 MED ORDER — TRULICITY 1.5 MG/0.5ML ~~LOC~~ SOAJ: 1.5000 mg | SUBCUTANEOUS | 2 refills | Status: DC

## 2022-04-23 NOTE — Progress Notes (Signed)
Established Patient Office Visit  Subjective   Patient ID: Renee Wade, female    DOB: 09/13/1960  Age: 61 y.o. MRN: [id number]  Chief Complaint  Patient presents with   Follow-up    Dm and htn fup    HPI [name] a 61 yo obese female with T2DM, HLD, HTN, CKD who presents to the clinic for 3 months follow up.   [name] not checking sugars. Pt declines any hypoglycemic events. No open sores or wounds. She denies any CP, palpitations, headaches or vision changes. She admits to not keeping a healthy diet and not exercising. She is compliant with medications. She has had some intermittent lower ext edema. Mostly after sitting for a while. No SOB or problems laying flat. Goes away at night.   .. Active Ambulatory Problems    Diagnosis Date Noted   Esophageal stenosis 12/18/2015   Gastroesophageal reflux disease with esophagitis 12/18/2015   Essential hypertension, benign 12/18/2015   History of DVT (deep vein thrombosis) 12/18/2015   Anxiety state 12/18/2015   CKD (chronic kidney disease) stage 3, GFR 30-59 ml/min (HCC) 12/18/2015   Ganglion cyst of wrist, left 01/08/2016   Adenomatous polyp 02/13/2016   It band syndrome, left 11/12/2016   Trochanteric bursitis of left hip 11/12/2016   Type II diabetes mellitus with stage 3 chronic kidney disease ([place]) 11/13/2016   Dyslipidemia, goal LDL below 70 11/14/2016   Lower extremity edema 03/17/2018   Statin declined 04/05/2018   Acute idiopathic gout of left foot 05/28/2018   Menopausal symptoms 02/28/2019   Bacterial vaginosis 10/16/2020   Epigastric pain 10/17/2020   Infected olecranon bursa, left 10/02/2021   Prolonged severe nausea and vomiting 10/16/2021   Right flank pain 10/16/2021   Nausea 01/15/2022   Microalbuminuria 01/15/2022   Statin myopathy 04/23/2022   Resolved Ambulatory Problems    Diagnosis Date Noted   Class 2 severe obesity due to excess calories with serious comorbidity and body mass index (BMI) of 39.0 to  39.9 in adult ([place]) 12/19/2015   BMI 39.0-39.9,adult 02/01/2017   Past Medical History:  Diagnosis Date   Depression    H/O DVT    Hypertension    Obesity     ROS Intermittent lower ext edema   Objective:     BP 125/63   Pulse 64   Ht '5\' 9"'$  (1.753 m)   Wt 252 lb (114.3 kg)   SpO2 99%   BMI 37.21 kg/m  BP Readings from Last 3 Encounters:  04/23/22 125/63  01/15/22 (!) 119/48  12/13/21 113/71   Wt Readings from Last 3 Encounters:  04/23/22 252 lb (114.3 kg)  01/15/22 253 lb (114.8 kg)  10/16/21 250 lb (113.4 kg)   .Marland Kitchen Results for orders placed or performed in visit on 04/23/22  POCT glycosylated hemoglobin (Hb A1C)  Result Value Ref Range   Hemoglobin A1C     HbA1c POC (<> result, manual entry) 7.3 4.0 - 5.6 %   HbA1c, POC (prediabetic range)     HbA1c, POC (controlled diabetic range)        Physical Exam Constitutional:      Appearance: Normal appearance. She is obese.  HENT:     Head: Normocephalic.  Cardiovascular:     Rate and Rhythm: Normal rate and regular rhythm.     Pulses: Normal pulses.     Heart sounds: Normal heart sounds.  Pulmonary:     Effort: Pulmonary effort is normal.     Breath  sounds: Normal breath sounds.  Musculoskeletal:     Right lower leg: No edema.     Left lower leg: No edema.  Neurological:     General: No focal deficit present.     Mental Status: She is alert and oriented to person, place, and time.  Psychiatric:        Mood and Affect: Mood normal.      Assessment & Plan:  Marland [name] KitchenRennee was seen today for follow-up.  Diagnoses and all orders for this visit:  Type 2 diabetes mellitus with stage 3 chronic kidney disease, without long-term current use of insulin, unspecified whether stage 3a or 3b CKD (HCC) -     POCT glycosylated hemoglobin (Hb A1C) -     Empagliflozin-metFORMIN HCl ER (SYNJARDY XR) 03-999 MG TB24; Take 1 tablet by mouth daily. -     Dulaglutide (TRULICITY) 1.5 IB/7.0WU SOPN; Inject 1.5 mg into the  skin once a week.  Stage 3b chronic kidney disease (HCC)  Microalbuminuria  Statin myopathy  Dyslipidemia, goal LDL below 70  Lower extremity edema -     hydrochlorothiazide (HYDRODIURIL) 12.5 MG tablet; Take as needed for lower extremity swelling.   A1C not to goal Increased trulicity to 1.'5mg'$  weekly Contineu synjardy Discussed low sugar/carb diet BP to goal Discussed lower extremity edema-consider compression stockings and as needed HCTZ Does not tolerate statin Continue on repatha Declined all vaccines Labs UTD Follow up in 3 months   Iran Planas, PA-C

## 2022-04-30 ENCOUNTER — Other Ambulatory Visit: Payer: Self-pay | Admitting: Neurology

## 2022-04-30 MED ORDER — HYDROCORTISONE 2.5 % EX OINT: TOPICAL_OINTMENT | CUTANEOUS | 0 refills | Status: AC

## 2022-04-30 NOTE — Telephone Encounter (Signed)
Patient called LVM stating she was cutting wood and got poison ivy. Has areas on left arm and stomach. She is asking if we can send in Hydrocortisone cream to pharmacy. Please advise.

## 2022-04-30 NOTE — Telephone Encounter (Signed)
Patient made aware.

## 2022-05-20 ENCOUNTER — Ambulatory Visit (INDEPENDENT_AMBULATORY_CARE_PROVIDER_SITE_OTHER): Payer: BC Managed Care – PPO

## 2022-05-20 ENCOUNTER — Ambulatory Visit: Payer: BC Managed Care – PPO | Admitting: Sports Medicine

## 2022-05-20 DIAGNOSIS — M545 Low back pain, unspecified: Secondary | ICD-10-CM

## 2022-05-20 DIAGNOSIS — M47816 Spondylosis without myelopathy or radiculopathy, lumbar region: Secondary | ICD-10-CM

## 2022-05-20 MED ORDER — PREDNISONE 50 MG PO TABS: ORAL_TABLET | ORAL | 0 refills | Status: DC

## 2022-05-20 MED ORDER — MELOXICAM 15 MG PO TABS: ORAL_TABLET | ORAL | 3 refills | Status: DC

## 2022-05-20 NOTE — Assessment & Plan Note (Signed)
Axial low back pain, left-sided with occasional radiation down the left leg. CT did show lumbar spinal stenosis. Adding 5 days of prednisone followed by meloxicam, x-rays, home physical therapy, return to see me in 6 weeks, MR for interventional planning if not better.

## 2022-05-20 NOTE — Progress Notes (Signed)
    Procedures performed today:    None.  Independent interpretation of notes and tests performed by another provider:   I did personally review a CT scan of the abdomen and pelvis, with attention paid to the lumbar spine, she does have multilevel spinal stenosis worst at L4-L5 and L5-S1, there is also multilevel facet joint osteoarthritis.  Brief History, Exam, Impression, and Recommendations:    Lumbar spondylosis Axial low back pain, left-sided with occasional radiation down the left leg. CT did show lumbar spinal stenosis. Adding 5 days of prednisone followed by meloxicam, x-rays, home physical therapy, return to see me in 6 weeks, MR for interventional planning if not better.    ____________________________________________ Gwen Her. Dianah Field, M.D., ABFM., CAQSM., AME. Primary Care and Sports Medicine [place] [facility]  Adjunct Professor of [place] of [facility] of Medicine  Risk manager

## 2022-05-21 ENCOUNTER — Encounter: Payer: Self-pay | Admitting: Sports Medicine

## 2022-05-23 ENCOUNTER — Other Ambulatory Visit: Payer: Self-pay | Admitting: Family Medicine

## 2022-05-23 DIAGNOSIS — K2100 Gastro-esophageal reflux disease with esophagitis, without bleeding: Secondary | ICD-10-CM

## 2022-06-06 ENCOUNTER — Other Ambulatory Visit (HOSPITAL_BASED_OUTPATIENT_CLINIC_OR_DEPARTMENT_OTHER): Payer: Self-pay

## 2022-06-24 ENCOUNTER — Other Ambulatory Visit: Payer: Self-pay | Admitting: Family Medicine

## 2022-06-24 ENCOUNTER — Other Ambulatory Visit: Payer: Self-pay | Admitting: Physician Assistant

## 2022-06-24 DIAGNOSIS — F411 Generalized anxiety disorder: Secondary | ICD-10-CM

## 2022-06-24 NOTE — Telephone Encounter (Signed)
Last written 10/02/2021 #180 with 1 refill  Last appt 04/23/2022

## 2022-07-01 ENCOUNTER — Ambulatory Visit: Payer: BC Managed Care – PPO | Admitting: Sports Medicine

## 2022-07-25 ENCOUNTER — Other Ambulatory Visit: Payer: Self-pay | Admitting: Physician Assistant

## 2022-07-25 DIAGNOSIS — E1122 Type 2 diabetes mellitus with diabetic chronic kidney disease: Secondary | ICD-10-CM

## 2022-08-05 ENCOUNTER — Other Ambulatory Visit: Payer: Self-pay

## 2022-08-05 ENCOUNTER — Other Ambulatory Visit: Payer: Self-pay | Admitting: Physician Assistant

## 2022-08-05 DIAGNOSIS — E785 Hyperlipidemia, unspecified: Secondary | ICD-10-CM

## 2022-08-13 ENCOUNTER — Ambulatory Visit: Payer: BC Managed Care – PPO | Admitting: Physician Assistant

## 2022-08-13 ENCOUNTER — Encounter: Payer: Self-pay | Admitting: Physician Assistant

## 2022-08-13 VITALS — BP 107/58 | HR 96 | Ht 69.0 in | Wt 251.0 lb

## 2022-08-13 DIAGNOSIS — N183 Chronic kidney disease, stage 3 unspecified: Secondary | ICD-10-CM

## 2022-08-13 DIAGNOSIS — E785 Hyperlipidemia, unspecified: Secondary | ICD-10-CM

## 2022-08-13 DIAGNOSIS — E1122 Type 2 diabetes mellitus with diabetic chronic kidney disease: Secondary | ICD-10-CM

## 2022-08-13 DIAGNOSIS — Z1329 Encounter for screening for other suspected endocrine disorder: Secondary | ICD-10-CM

## 2022-08-13 DIAGNOSIS — R809 Proteinuria, unspecified: Secondary | ICD-10-CM | POA: Diagnosis not present

## 2022-08-13 DIAGNOSIS — Z79899 Other long term (current) drug therapy: Secondary | ICD-10-CM

## 2022-08-13 DIAGNOSIS — T466X5A Adverse effect of antihyperlipidemic and antiarteriosclerotic drugs, initial encounter: Secondary | ICD-10-CM

## 2022-08-13 DIAGNOSIS — G720 Drug-induced myopathy: Secondary | ICD-10-CM

## 2022-08-13 LAB — POCT GLYCOSYLATED HEMOGLOBIN (HGB A1C): Hemoglobin A1C: 6.2 % — AB (ref 4.0–5.6)

## 2022-08-13 NOTE — Progress Notes (Signed)
Established Patient Office Visit  Subjective   Patient ID: Renee Wade, female    DOB: [birth year]  Age: 62 y.o. MRN: [id number]  Chief Complaint  Patient presents with   Follow-up   Diabetes    HPI [name] a 62 yo obese female with T2DM, HTN, HLD, GERD, CKD who presents to the clinic for 3 month follow up.   She is in active tax season and not eating healthy. She is staying compliant with medications. Denies any hypoglycemic events. Not checking sugars. No open wounds or sores. No hypoglycemic events. Denies any CP, palpitations, headaches or vision changes.   .. Active Ambulatory Problems    Diagnosis Date Noted   Esophageal stenosis 12/18/2015   Gastroesophageal reflux disease with esophagitis 12/18/2015   Essential hypertension, benign 12/18/2015   History of DVT (deep vein thrombosis) 12/18/2015   Anxiety state 12/18/2015   CKD (chronic kidney disease) stage 3, GFR 30-59 ml/min (HCC) 12/18/2015   Ganglion cyst of wrist, left 01/08/2016   Adenomatous polyp 02/13/2016   It band syndrome, left 11/12/2016   Trochanteric bursitis of left hip 11/12/2016   Type II diabetes mellitus with stage 3 chronic kidney disease ([place]) 11/13/2016   Dyslipidemia, goal LDL below 70 11/14/2016   Lower extremity edema 03/17/2018   Statin declined 04/05/2018   Acute idiopathic gout of left foot 05/28/2018   Menopausal symptoms 02/28/2019   Bacterial vaginosis 10/16/2020   Epigastric pain 10/17/2020   Infected olecranon bursa, left 10/02/2021   Prolonged severe nausea and vomiting 10/16/2021   Right flank pain 10/16/2021   Nausea 01/15/2022   Microalbuminuria 01/15/2022   Statin myopathy 04/23/2022   Lumbar spondylosis 05/20/2022   Resolved Ambulatory Problems    Diagnosis Date Noted   Class 2 severe obesity due to excess calories with serious comorbidity and body mass index (BMI) of 39.0 to 39.9 in adult [facility] 12/19/2015   BMI 39.0-39.9,adult 02/01/2017   Past Medical History:   Diagnosis Date   Depression    H/O DVT    Hypertension    Obesity      ROS See HPI.    Objective:     There were no vitals taken for this visit. BP Readings from Last 3 Encounters:  08/13/22 (!) 107/58  04/23/22 125/63  01/15/22 (!) 119/48   Wt Readings from Last 3 Encounters:  08/13/22 251 lb (113.9 kg)  04/23/22 252 lb (114.3 kg)  01/15/22 253 lb (114.8 kg)    ..    01/15/2022    7:22 AM 10/02/2021    7:38 AM 07/03/2021    7:49 AM 10/16/2020   10:17 AM 04/03/2020    8:48 AM  Depression screen PHQ 2/9  Decreased Interest 1 1 0 1 0  Down, Depressed, Hopeless 0 0 1 1 0  PHQ - 2 Score '1 1 1 2 '$ 0  Altered sleeping    3 2  Tired, decreased energy    2 1  Change in appetite    3 0  Feeling bad or failure about yourself     0 0  Trouble concentrating    1 3  Moving slowly or fidgety/restless    0 1  Suicidal thoughts    0 0  PHQ-9 Score    11 7  Difficult doing work/chores    Somewhat difficult Somewhat difficult   .Renee Wade Results for orders placed or performed in visit on 08/13/22  POCT glycosylated hemoglobin (Hb A1C)  Result Value Ref Range  Hemoglobin A1C 6.2 (A) 4.0 - 5.6 %   HbA1c POC (<> result, manual entry)     HbA1c, POC (prediabetic range)     HbA1c, POC (controlled diabetic range)        Physical Exam Constitutional:      Appearance: Normal appearance. She is obese.  HENT:     Head: Normocephalic.  Cardiovascular:     Rate and Rhythm: Normal rate and regular rhythm.     Pulses: Normal pulses.  Pulmonary:     Effort: Pulmonary effort is normal.     Breath sounds: Normal breath sounds.  Musculoskeletal:     Cervical back: Normal range of motion and neck supple.     Right lower leg: No edema.     Left lower leg: No edema.  Neurological:     General: No focal deficit present.     Mental Status: She is alert and oriented to person, place, and time.  Psychiatric:        Mood and Affect: Mood normal.          Assessment & Plan:  Renee [name] KitchenChestina  was seen today for follow-up and diabetes.  Diagnoses and all orders for this visit:  Type 2 diabetes mellitus with stage 3 chronic kidney disease, without long-term current use of insulin, unspecified whether stage 3a or 3b CKD (HCC) -     POCT glycosylated hemoglobin (Hb A1C) -     COMPLETE METABOLIC PANEL WITH GFR  Dyslipidemia, goal LDL below 70 -     Lipid Panel w/reflex Direct LDL  Thyroid disorder screen -     TSH  Medication management -     POCT glycosylated hemoglobin (Hb A1C) -     TSH -     Lipid Panel w/reflex Direct LDL -     COMPLETE METABOLIC PANEL WITH GFR -     CBC with Differential/Platelet  Statin myopathy  Microalbuminuria   A1c to goal  Continue on trulicity, synjardy Refilled sent BP to goal, refilled benicar Does not tolerate statin, on repatha Needs eye exam Needs foot exam but has compression stockings on and does not want to take off today Declines flu and covid vaccines Follow up in 3 months  Renee Planas, PA-C

## 2022-09-02 ENCOUNTER — Other Ambulatory Visit: Payer: Self-pay | Admitting: Physician Assistant

## 2022-09-02 DIAGNOSIS — E1122 Type 2 diabetes mellitus with diabetic chronic kidney disease: Secondary | ICD-10-CM

## 2022-09-12 ENCOUNTER — Other Ambulatory Visit: Payer: Self-pay | Admitting: Physician Assistant

## 2022-09-12 DIAGNOSIS — I10 Essential (primary) hypertension: Secondary | ICD-10-CM

## 2022-09-30 ENCOUNTER — Ambulatory Visit (INDEPENDENT_AMBULATORY_CARE_PROVIDER_SITE_OTHER): Payer: BC Managed Care – PPO

## 2022-09-30 ENCOUNTER — Encounter: Payer: Self-pay | Admitting: Family Medicine

## 2022-09-30 ENCOUNTER — Encounter: Payer: Self-pay | Admitting: Physician Assistant

## 2022-09-30 ENCOUNTER — Ambulatory Visit: Payer: BC Managed Care – PPO | Admitting: Family Medicine

## 2022-09-30 ENCOUNTER — Other Ambulatory Visit: Payer: Self-pay | Admitting: Physician Assistant

## 2022-09-30 VITALS — Ht 69.0 in | Wt 253.0 lb

## 2022-09-30 DIAGNOSIS — R350 Frequency of micturition: Secondary | ICD-10-CM | POA: Insufficient documentation

## 2022-09-30 DIAGNOSIS — R109 Unspecified abdominal pain: Secondary | ICD-10-CM

## 2022-09-30 DIAGNOSIS — N183 Chronic kidney disease, stage 3 unspecified: Secondary | ICD-10-CM

## 2022-09-30 DIAGNOSIS — K5900 Constipation, unspecified: Secondary | ICD-10-CM | POA: Diagnosis not present

## 2022-09-30 LAB — POCT URINALYSIS DIP (CLINITEK)
Bilirubin, UA: NEGATIVE
Blood, UA: NEGATIVE
Glucose, UA: 500 mg/dL — AB
Ketones, POC UA: NEGATIVE mg/dL
Leukocytes, UA: NEGATIVE
Nitrite, UA: NEGATIVE
POC PROTEIN,UA: NEGATIVE
Spec Grav, UA: 1.02 (ref 1.010–1.025)
Urobilinogen, UA: 0.2 E.U./dL
pH, UA: 5.5 (ref 5.0–8.0)

## 2022-09-30 MED ORDER — FLUCONAZOLE 150 MG PO TABS: 150.0000 mg | ORAL_TABLET | Freq: Once | ORAL | 0 refills | Status: AC

## 2022-09-30 NOTE — Assessment & Plan Note (Signed)
UA with glucosuria.  Likely related to SGLT-2 use.  Checking renal function to see if glucose has increased and evaluate renal function.  KUB ordered  as she is also having some L flankj pain.  Will cover with 1 time dose of fluconazole to cover for possible yeast given her SGLT-2 use as well.

## 2022-09-30 NOTE — Progress Notes (Signed)
Renee Wade - 62 y.o. female MRN [id number]  Date of birth: [birth year]  Subjective Chief Complaint  Patient presents with   Urinary Frequency    HPI Renee Wade is a 62 y.o.  female here today with complaint of possible UTI.  She is having low back pain and urinary frequency.  Symptoms started about 1 week ago.  She has had kidney stones in the past but this feels different.  She has noted hematuria, fever, or chills. 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  Admits that activity levels are decreased and her diet has not been as good.   ROS:  A comprehensive ROS was completed and negative except as noted per HPI  Allergies  Allergen Reactions   Morphine Nausea And Vomiting   Oxycodone Nausea And Vomiting   Belviq [Lorcaserin Hcl] Cough   Codeine Nausea And Vomiting   Contrave [Naltrexone-Bupropion Hcl Er]     Irritable.    Meloxicam     "Felt crazy"   Metformin And Related     Diarrhea/GI upset   Ozempic (0.25 Or 0.5 Mg-Dose) [Semaglutide(0.25 Or 0.5mg -Dos)]     nausea   Prednisone     Insomnia,mood changes Oral ONLY>    Past Medical History:  Diagnosis Date   Depression    H/O DVT    S/p surgery/hysterectomy in 2008   Hypertension    Obesity     Past Surgical History:  Procedure Laterality Date   CESAREAN SECTION  06/10/1987   TONSILLECTOMY  06/09/1970   TOTAL ABDOMINAL HYSTERECTOMY  06/10/2007   WISDOM TOOTH EXTRACTION  06/10/1987    Social History   Socioeconomic History   Marital status: Married    Spouse name: Not on file   Number of children: Not on file   Years of education: Not on file   Highest education level: Not on file  Occupational History   Not on file  Tobacco Use   Smoking status: Never   Smokeless tobacco: Never  Vaping Use   Vaping Use: Never used  Substance and Sexual Activity   Alcohol use: No    Alcohol/week: 0.0 standard drinks of alcohol   Drug use: No   Sexual  activity: Yes    Partners: Male  Other Topics Concern   Not on file  Social History Narrative   Not on file   Social Determinants of Health   Financial Resource Strain: Not on file  Food Insecurity: Not on file  Transportation Needs: Not on file  Physical Activity: Not on file  Stress: Not on file  Social Connections: Not on file    Family History  Problem Relation Age of Onset   Ovarian cancer Other        grandmother   Heart attack Father    Diabetes Other        grandmother     Health Maintenance  Topic Date Due   HIV Screening  Never done   OPHTHALMOLOGY EXAM  02/28/2022   FOOT EXAM  11/13/2022 (Originally 07/03/2022)   Zoster Vaccines- Shingrix (1 of 2) 11/13/2022 (Originally 07/18/1980)   COLONOSCOPY (Pts 45-86yrs Insurance coverage will need to be confirmed)  08/13/2023 (Originally 05/11/2022)   COVID-19 Vaccine (5 - 2023-24 season) 08/13/2023 (Originally 02/07/2022)   INFLUENZA VACCINE  01/08/2023   Diabetic kidney evaluation - eGFR measurement  01/16/2023   Diabetic kidney evaluation - Urine ACR  01/16/2023   MAMMOGRAM  01/16/2023  HEMOGLOBIN A1C  02/13/2023   DTaP/Tdap/Td (3 - Td or Tdap) 05/27/2028   Hepatitis C Screening  Completed   HPV VACCINES  Aged Out     ----------------------------------------------------------------------------------------------------------------------------------------------------------------------------------------------------------------- Physical Exam Ht  (1.753 m)   Wt 253 lb (114.8 kg)   BMI 37.36 kg/m   Physical Exam Constitutional:      Appearance: Normal appearance.  HENT:     Head: Normocephalic and atraumatic.  Eyes:     General: No scleral icterus. Cardiovascular:     Rate and Rhythm: Normal rate and regular rhythm.  Pulmonary:     Effort: Pulmonary effort is normal.     Breath sounds: Normal breath sounds.  Abdominal:     Tenderness: There is no right CVA tenderness or left CVA tenderness.   Neurological:     Mental Status: She is alert.  Psychiatric:        Mood and Affect: Mood normal.        Behavior: Behavior normal.     ------------------------------------------------------------------------------------------------------------------------------------------------------------------------------------------------------------------- Assessment and Plan  Urinary frequency UA with glucosuria.  Likely related to SGLT-2 use.  Checking renal function to see if glucose has increased and evaluate renal function.  KUB ordered  as she is also having some L flankj pain.  Will cover with 1 time dose of fluconazole to cover for possible yeast given her SGLT-2 use as well.    Meds ordered this encounter  Medications   fluconazole (DIFLUCAN) 150 MG tablet    Sig: Take 1 tablet (150 mg total) by mouth once for 1 dose.    Dispense:  1 tablet    Refill:  0    No follow-ups on file.    This visit occurred during the SARS-CoV-2 public health emergency.  Safety protocols were in place, including screening questions prior to the visit, additional usage of staff PPE, and extensive cleaning of exam room while observing appropriate contact time as indicated for disinfecting solutions.

## 2022-10-01 LAB — BASIC METABOLIC PANEL
BUN/Creatinine Ratio: 12 (calc) (ref 6–22)
BUN: 19 mg/dL (ref 7–25)
CO2: 29 mmol/L (ref 20–32)
Calcium: 10.2 mg/dL (ref 8.6–10.4)
Chloride: 103 mmol/L (ref 98–110)
Creat: 1.56 mg/dL — ABNORMAL HIGH (ref 0.50–1.05)
Glucose, Bld: 137 mg/dL — ABNORMAL HIGH (ref 65–99)
Potassium: 3.9 mmol/L (ref 3.5–5.3)
Sodium: 141 mmol/L (ref 135–146)

## 2022-10-10 IMAGING — CT CT ABD-PELV W/O CM
2 of 4 series · 16 of 46 positions shown, 18 images · non-contrast
Comparison: None Available.

CLINICAL DATA: Right upper quadrant abdominal/right flank pain,
nausea, and vomiting for 1 week.



[Series 2: axial st · axial · 0.72mm/px · z∈[-439,+46]mm · 13 of 107 slices shown, 15 images]
[im 5/107  soft-tissue]
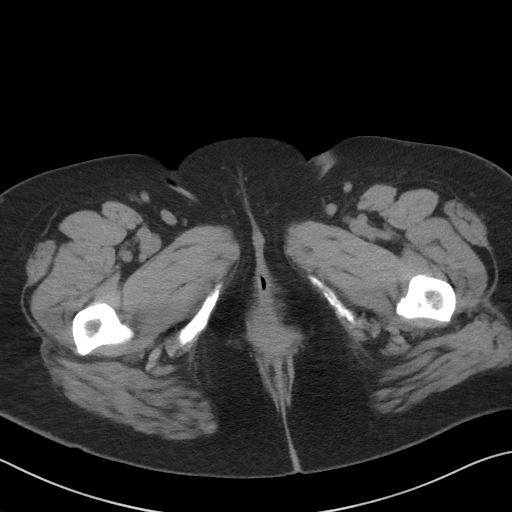
[im 5/107  bone]
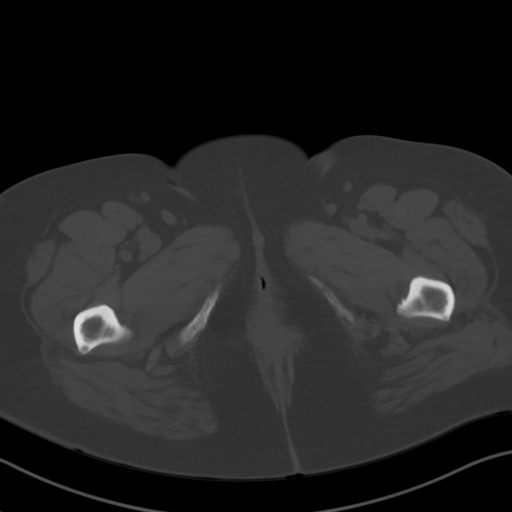
[im 14/107  soft-tissue]
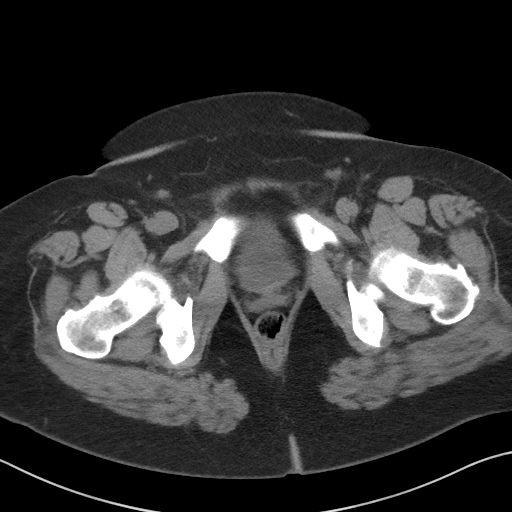
[im 24/107  soft-tissue]
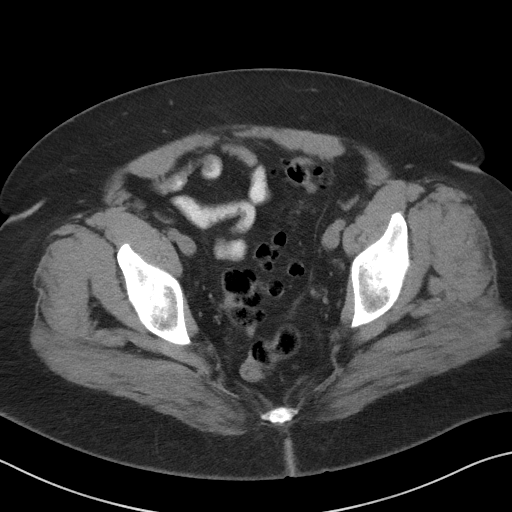
[im 28/107  soft-tissue]
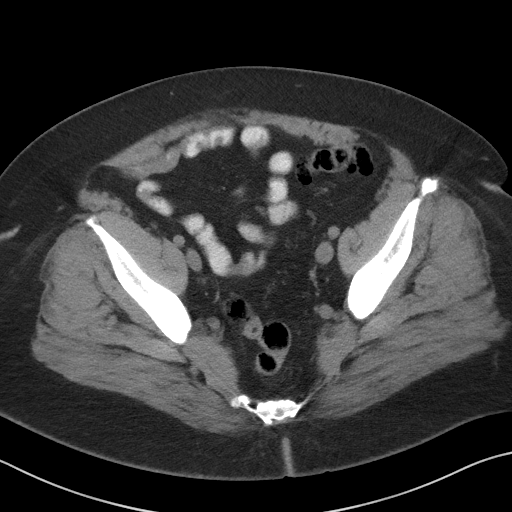
[im 37/107  soft-tissue]
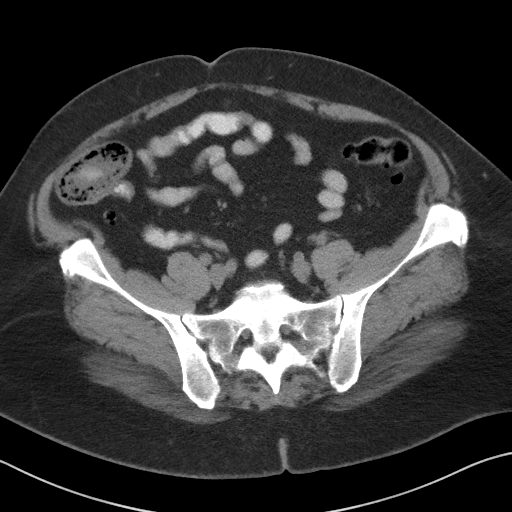
[im 47/107  soft-tissue]
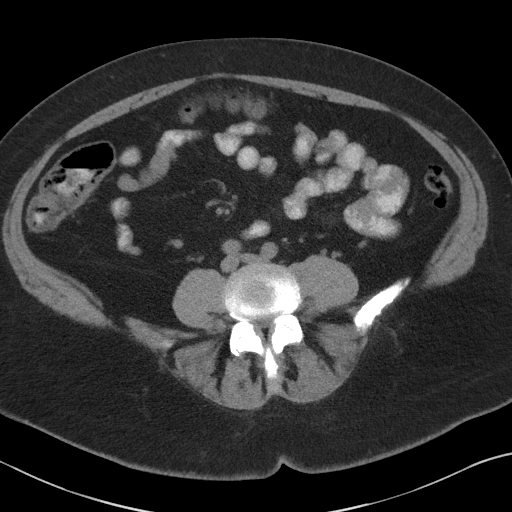
[im 56/107  soft-tissue]
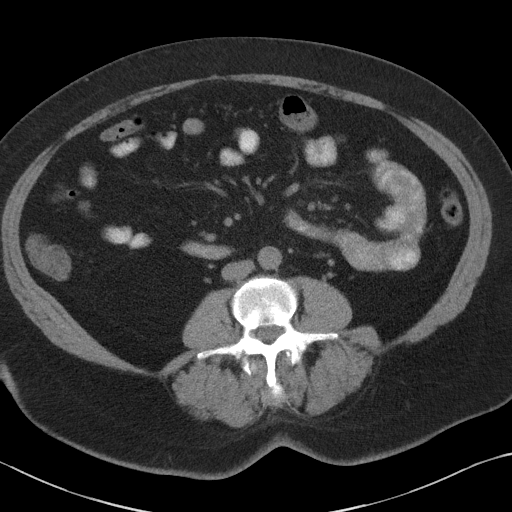
[im 60/107  soft-tissue]
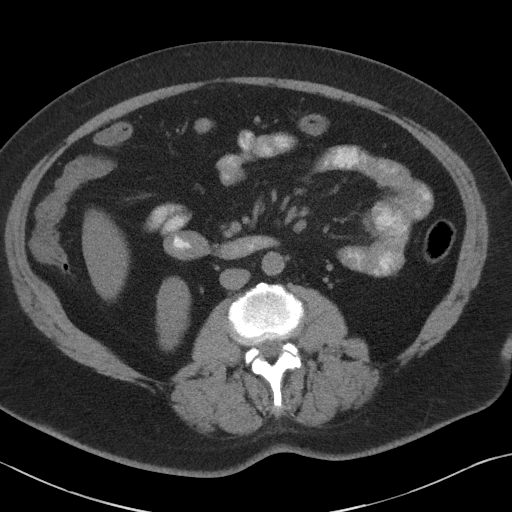
[im 70/107  soft-tissue]
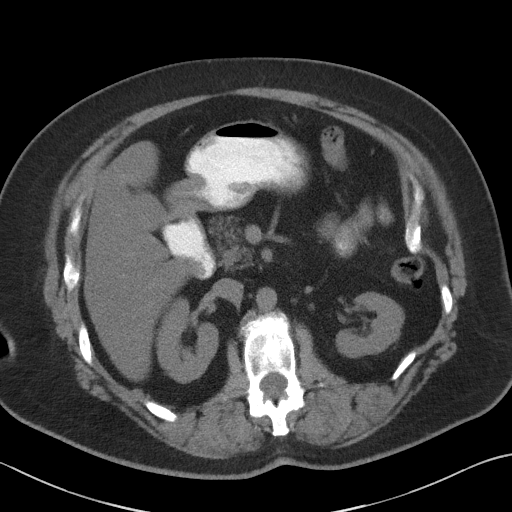
[im 70/107  bone]
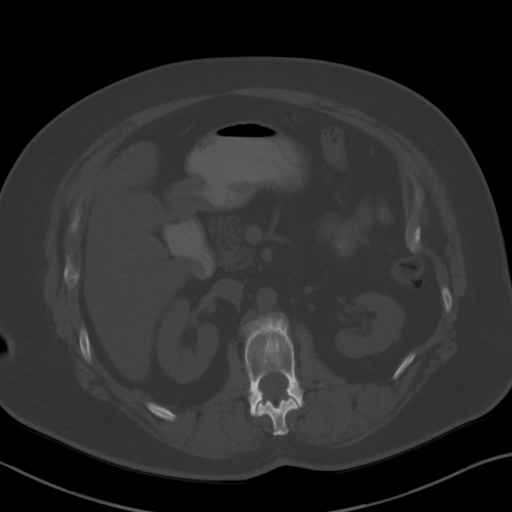
[im 79/107  soft-tissue]
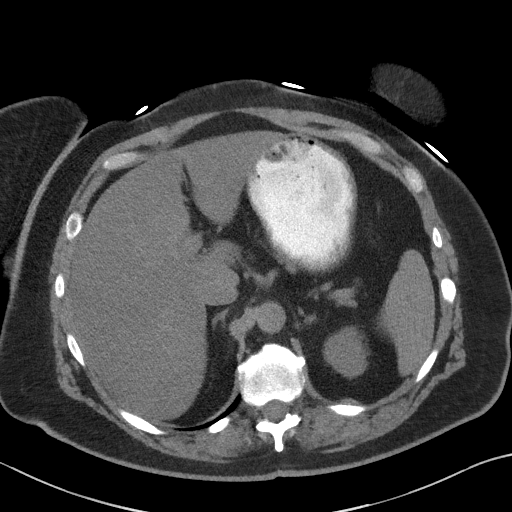
[im 83/107  soft-tissue]
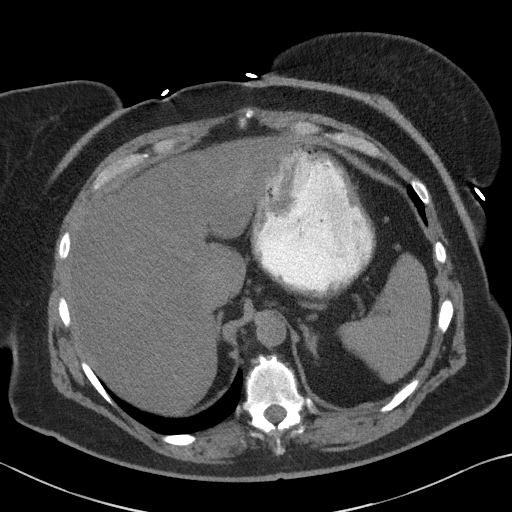
[im 93/107  soft-tissue]
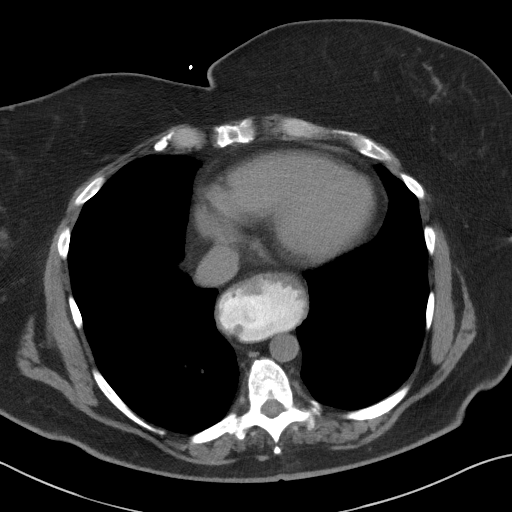
[im 102/107  soft-tissue]
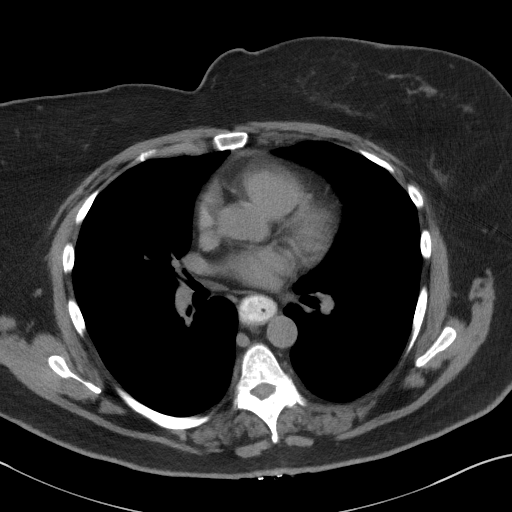

[Series 5: coronal st · coronal · 0.76mm/px · 3 of 96 slices shown]
[im 32/96  soft-tissue]
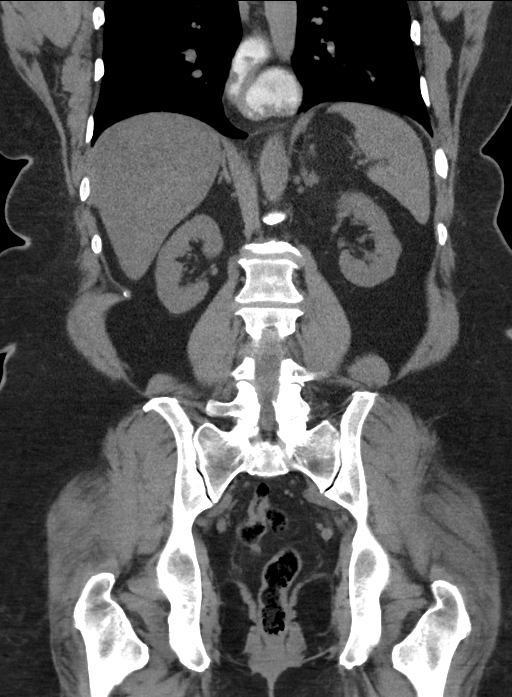
[im 43/96  soft-tissue]
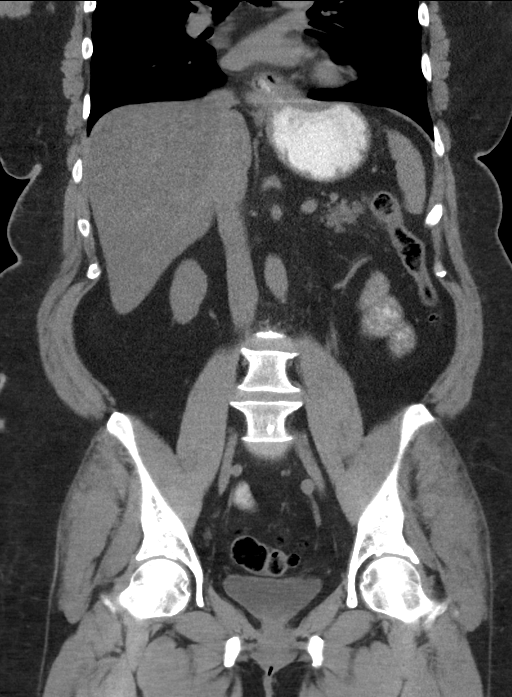
[im 53/96  soft-tissue]
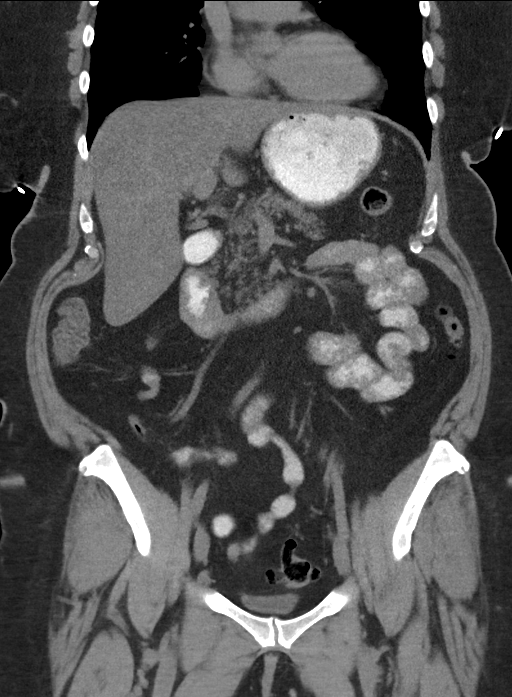

[16 of 46 positions shown; findings below may reference images not displayed]

FINDINGS: Lower chest: Clear lung bases. Small to moderate-sized sliding
hiatal hernia. Contrast in the included distal esophagus suggesting
reflux.

Hepatobiliary: Diffusely decreased attenuation of the liver
compatible with steatosis. Unremarkable gallbladder. No biliary
dilatation.

Pancreas: Fatty infiltration of the pancreas most notable in the
head region. No ductal dilatation or acute inflammation.

Spleen: Unremarkable.

Adrenals/Urinary Tract: Unremarkable adrenal glands. No evidence of
a renal mass, calculi, or hydronephrosis. Unremarkable bladder.

Stomach/Bowel: There is no evidence of bowel obstruction or
inflammation. Colonic diverticulosis is noted without evidence
diverticulitis. The appendix is unremarkable.

Vascular/Lymphatic: Normal caliber of the abdominal aorta. Mildly
prominent number of borderline enlarged mesenteric lymph nodes
measuring up to 5 mm in short axis, nonspecific but potentially
reactive. No bulky lymphadenopathy.

Reproductive: Status post hysterectomy. No adnexal masses.

Other: No ascites or pneumoperitoneum.

Musculoskeletal: No acute osseous abnormality or suspicious osseous
lesion. Mild-to-moderate thoracolumbar spondylosis and facet
arthrosis.
IMPRESSION: 1. No acute abnormality identified in the abdomen or pelvis.
2. Hepatic steatosis.
3. Hiatal hernia.
4. Colonic diverticulosis.

## 2022-10-22 ENCOUNTER — Encounter: Payer: Self-pay | Admitting: Physician Assistant

## 2022-10-28 ENCOUNTER — Other Ambulatory Visit: Payer: Self-pay | Admitting: Physician Assistant

## 2022-10-28 DIAGNOSIS — E1122 Type 2 diabetes mellitus with diabetic chronic kidney disease: Secondary | ICD-10-CM

## 2022-11-03 ENCOUNTER — Ambulatory Visit
Admission: EM | Admit: 2022-11-03 | Discharge: 2022-11-03 | Disposition: A | Discharge status: Home / Self Care | Payer: BC Managed Care – PPO | Attending: Family Medicine | Admitting: Family Medicine

## 2022-11-03 ENCOUNTER — Encounter: Payer: Self-pay | Admitting: Emergency Medicine

## 2022-11-03 DIAGNOSIS — J069 Acute upper respiratory infection, unspecified: Secondary | ICD-10-CM

## 2022-11-03 DIAGNOSIS — R059 Cough, unspecified: Secondary | ICD-10-CM

## 2022-11-03 MED ORDER — METHYLPREDNISOLONE SODIUM SUCC 125 MG IJ SOLR
125.0000 mg | Freq: Once | INTRAMUSCULAR | Status: AC
  Administered 2022-11-03: 125 mg via INTRAMUSCULAR

## 2022-11-03 MED ORDER — AZITHROMYCIN 250 MG PO TABS: 250.0000 mg | ORAL_TABLET | Freq: Every day | ORAL | 0 refills | Status: DC

## 2022-11-03 MED ORDER — PROMETHAZINE-DM 6.25-15 MG/5ML PO SYRP: 5.0000 mL | ORAL_SOLUTION | Freq: Two times a day (BID) | ORAL | 0 refills | Status: DC | PRN

## 2022-11-03 NOTE — ED Triage Notes (Signed)
Patient c/o cough x 3-4 days.  Unable to sleep due to cough.  Denies any OTC cough/cold meds other than cough drops.

## 2022-11-03 NOTE — Discharge Instructions (Addendum)
Advised patient to take medication as directed with food to completion.  Advised may use Promethazine DM at night prior to sleep for cough.  Patient has been advised of sedative effects of this medication.  Encouraged increase daily water intake to 64 ounces per day while taking these medications.  Advised if symptoms worsen and/or unresolved please follow-up with PCP or here for further evaluation.

## 2022-11-03 NOTE — ED Provider Notes (Signed)
[facility]    CSN: [id number] Arrival date & time: 11/03/22  1254      History   Chief Complaint Chief Complaint  Patient presents with   Cough    HPI Renee Wade is a 62 y.o. female.   HPI Pleasant 62 year old female presents with cough for 4 days.  Reports she is unable to sleep due to cough.  PMH significant for morbid obesity, history of DVT, and T2DM with CKD stage III.  Patient is accompanied by her husband today who will also be evaluated for cough.  Past Medical History:  Diagnosis Date   Depression    H/O DVT    S/p surgery/hysterectomy in 2008   Hypertension    Obesity     Patient Active Problem List   Diagnosis Date Noted   Urinary frequency 09/30/2022   Lumbar spondylosis 05/20/2022   Statin myopathy 04/23/2022   Nausea 01/15/2022   Microalbuminuria 01/15/2022   Prolonged severe nausea and vomiting 10/16/2021   Right flank pain 10/16/2021   Infected olecranon bursa, left 10/02/2021   Epigastric pain 10/17/2020   Bacterial vaginosis 10/16/2020   Menopausal symptoms 02/28/2019   Acute idiopathic gout of left foot 05/28/2018   Statin declined 04/05/2018   Lower extremity edema 03/17/2018   Dyslipidemia, goal LDL below 70 11/14/2016   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) 11/13/2016   It band syndrome, left 11/12/2016   Trochanteric bursitis of left hip 11/12/2016   Adenomatous polyp 02/13/2016   Ganglion cyst of wrist, left 01/08/2016   Esophageal stenosis 12/18/2015   Gastroesophageal reflux disease with esophagitis 12/18/2015   Essential hypertension, benign 12/18/2015   History of DVT (deep vein thrombosis) 12/18/2015   Anxiety state 12/18/2015   CKD (chronic kidney disease) stage 3, GFR 30-59 ml/min (HCC) 12/18/2015    Past Surgical History:  Procedure Laterality Date   CESAREAN SECTION  06/10/1987   TONSILLECTOMY  06/09/1970   TOTAL ABDOMINAL HYSTERECTOMY  06/10/2007   WISDOM TOOTH  EXTRACTION  06/10/1987    OB History   No obstetric history on file.      Home Medications    Prior to Admission medications   Medication Sig Start Date End Date Taking? Authorizing Provider  aspirin 325 MG tablet Take by mouth.   Yes [provider]  azithromycin (ZITHROMAX) 250 MG tablet Take 1 tablet (250 mg total) by mouth daily. Take first 2 tablets together, then 1 every day until finished. 11/03/22  Yes Trevor Iha, FNP  azithromycin (ZITHROMAX) 250 MG tablet Take 1 tablet (250 mg total) by mouth daily. Take first 2 tablets together, then 1 every day until finished. 11/03/22  Yes Trevor Iha, FNP  clonazePAM (KLONOPIN) 0.5 MG tablet Take 1 tablet (0.5 mg total) by mouth 2 (two) times daily as needed. for anxiety 06/24/22  Yes Breeback, [name] L, PA-C  colchicine 0.6 MG tablet Take 2 tablets as needed for gout attack and repeat 1 tablet in one hour if needed for up to 3 days. 05/20/19  Yes Breeback, [name] L, PA-C  diclofenac sodium (VOLTAREN) 1 % GEL Apply 2 g topically as needed. 09/30/17  Yes Breeback, [name] L, PA-C  Empagliflozin-metFORMIN HCl ER (SYNJARDY XR) 03-999 MG TB24 Take 1 tablet by mouth daily. 04/23/22  Yes Breeback, [name] L, PA-C  hydrocortisone 2.5 % ointment Apply to affected area BID. 04/30/22  Yes Breeback, [name] L, PA-C  Misc Natural Products (TART CHERRY ADVANCED PO) Take by mouth daily.  Yes [provider]  olmesartan-hydrochlorothiazide (BENICAR HCT) 20-12.5 MG tablet Take 1 tablet by mouth daily. 09/12/22  Yes Breeback, [name] L, PA-C  Omega 3-6-9 Fatty Acids (OMEGA 3-6-9 COMPLEX PO) Take by mouth daily.   Yes [provider]  ondansetron (ZOFRAN) 4 MG tablet Take 1 tablet (4 mg total) by mouth every 8 (eight) hours as needed for nausea or vomiting. 06/24/22  Yes Breeback, [name] L, PA-C  pantoprazole (PROTONIX) 40 MG tablet Take 1 tablet (40 mg total) by mouth 2 (two) times daily. 05/23/22  Yes Everrett Coombe, DO   promethazine-dextromethorphan (PROMETHAZINE-DM) 6.25-15 MG/5ML syrup Take 5 mLs by mouth 2 (two) times daily as needed for cough. 11/03/22  Yes Trevor Iha, FNP  promethazine-dextromethorphan (PROMETHAZINE-DM) 6.25-15 MG/5ML syrup Take 5 mLs by mouth 2 (two) times daily as needed for cough. 11/03/22  Yes Trevor Iha, FNP  REPATHA SURECLICK 140 MG/ML SOAJ INJECT 140MG  INTO THE SKIN EVERY 14 DAYS 08/05/22  Yes Breeback, [name] L, PA-C  TRULICITY 1.5 MG/0.5ML SOPN Inject 1.5 mg into the skin once a week. 10/28/22  Yes Breeback, Lonna Cobb, PA-C    Family History Family History  Problem Relation Age of Onset   Heart attack Father    Ovarian cancer Other        grandmother   Diabetes Other        grandmother     Social History Social History   Tobacco Use   Smoking status: Never   Smokeless tobacco: Never  Vaping Use   Vaping Use: Never used  Substance Use Topics   Alcohol use: No    Alcohol/week: 0.0 standard drinks of alcohol   Drug use: No     Allergies   Morphine, Oxycodone, Belviq [lorcaserin hcl], Codeine, Contrave [naltrexone-bupropion hcl er], Meloxicam, Metformin and related, Ozempic (0.25 or 0.5 mg-dose) [semaglutide(0.25 or 0.5mg -dos)], and Prednisone   Review of Systems Review of Systems  Respiratory:  Positive for cough.   All other systems reviewed and are negative.    Physical Exam Triage Vital Signs ED Triage Vitals  Enc Vitals Group     BP      Pulse      Resp      Temp      Temp src      SpO2      Weight      Height      Head Circumference      Peak Flow      Pain Score      Pain Loc      Pain Edu?      Excl. in GC?    No data found.  Updated Vital Signs BP 99/67 (BP Location: Right Arm)   Pulse (!) 104   Temp 98.2 F (36.8 C) (Oral)   Resp 18   Ht 5\' 9"  (1.753 m)   Wt 247 lb (112 kg)   SpO2 96%   BMI 36.48 kg/m     Physical Exam Vitals and nursing note reviewed.  Constitutional:      Appearance: Normal appearance. She is  obese. She is ill-appearing.  HENT:     Head: Normocephalic and atraumatic.     Right Ear: Tympanic membrane, ear canal and external ear normal.     Left Ear: Tympanic membrane, ear canal and external ear normal.     Mouth/Throat:     Mouth: Mucous membranes are moist.     Pharynx: Oropharynx is clear.  Eyes:     Extraocular  Movements: Extraocular movements intact.     Conjunctiva/sclera: Conjunctivae normal.     Pupils: Pupils are equal, round, and reactive to light.  Cardiovascular:     Rate and Rhythm: Normal rate and regular rhythm.     Pulses: Normal pulses.     Heart sounds: Normal heart sounds.  Pulmonary:     Effort: Pulmonary effort is normal.     Breath sounds: Normal breath sounds. No wheezing, rhonchi or rales.     Comments: Frequent nonproductive cough noted on exam Musculoskeletal:     Cervical back: Normal range of motion and neck supple.  Skin:    General: Skin is warm and dry.  Neurological:     General: No focal deficit present.     Mental Status: She is alert and oriented to person, place, and time. Mental status is at baseline.  Psychiatric:        Mood and Affect: Mood normal.        Behavior: Behavior normal.      UC Treatments / Results  Labs (all labs ordered are listed, but only abnormal results are displayed) Labs Reviewed - No data to display  EKG   Radiology No results found.  Procedures Procedures (including critical care time)  Medications Ordered in UC Medications  methylPREDNISolone sodium succinate (SOLU-MEDROL) 125 mg/2 mL injection 125 mg (125 mg Intramuscular Given 11/03/22 1326)    Initial Impression / Assessment and Plan / UC Course  I have reviewed the triage vital signs and the nursing notes.  Pertinent labs & imaging results that were available during my care of the patient were reviewed by me and considered in my medical decision making (see chart for details).     MDM: 1.  Acute upper respiratory infection-Rx'd  Zithromax (500 mg day 1, then 250 mg daily x 4 days); 2.  Cough-IM Solu-Medrol 125 mg given in clinic prior to discharge per patient request, Promethazine DM 6.25-15 mg / 5 mL syrup: Take 5 mL twice daily, as needed for cough. Advised patient to take medication as directed with food to completion.  Advised may use Promethazine DM at night prior to sleep for cough.  Patient has been advised of sedative effects of this medication.  Encouraged increase daily water intake to 64 ounces per day while taking these medications.  Advised if symptoms worsen and/or unresolved please follow-up with PCP or here for further evaluation.  Patient discharged home, hemodynamically stable.  Final Clinical Impressions(s) / UC Diagnoses   Final diagnoses:  Cough, unspecified type  Acute upper respiratory infection     Discharge Instructions      Advised patient to take medication as directed with food to completion.  Advised may use Promethazine DM at night prior to sleep for cough.  Patient has been advised of sedative effects of this medication.  Encouraged increase daily water intake to 64 ounces per day while taking these medications.  Advised if symptoms worsen and/or unresolved please follow-up with PCP or here for further evaluation.     ED Prescriptions     Medication Sig Dispense Auth. Provider   azithromycin (ZITHROMAX) 250 MG tablet Take 1 tablet (250 mg total) by mouth daily. Take first 2 tablets together, then 1 every day until finished. 6 tablet Trevor Iha, FNP   promethazine-dextromethorphan (PROMETHAZINE-DM) 6.25-15 MG/5ML syrup Take 5 mLs by mouth 2 (two) times daily as needed for cough. 118 mL Trevor Iha, FNP   azithromycin (ZITHROMAX) 250 MG tablet Take 1 tablet (250  mg total) by mouth daily. Take first 2 tablets together, then 1 every day until finished. 6 tablet Trevor Iha, FNP   promethazine-dextromethorphan (PROMETHAZINE-DM) 6.25-15 MG/5ML syrup Take 5 mLs by mouth 2 (two) times  daily as needed for cough. 118 mL Trevor Iha, FNP      PDMP not reviewed this encounter.   Trevor Iha, FNP 11/03/22 [phone]

## 2022-11-12 ENCOUNTER — Ambulatory Visit: Payer: BC Managed Care – PPO | Admitting: Physician Assistant

## 2022-11-12 ENCOUNTER — Encounter: Payer: Self-pay | Admitting: Physician Assistant

## 2022-11-12 VITALS — BP 128/72 | HR 78 | Wt 251.0 lb

## 2022-11-12 DIAGNOSIS — Z1321 Encounter for screening for nutritional disorder: Secondary | ICD-10-CM

## 2022-11-12 DIAGNOSIS — N1831 Chronic kidney disease, stage 3a: Secondary | ICD-10-CM | POA: Diagnosis not present

## 2022-11-12 DIAGNOSIS — E1122 Type 2 diabetes mellitus with diabetic chronic kidney disease: Secondary | ICD-10-CM | POA: Diagnosis not present

## 2022-11-12 DIAGNOSIS — R059 Cough, unspecified: Secondary | ICD-10-CM | POA: Diagnosis not present

## 2022-11-12 DIAGNOSIS — E785 Hyperlipidemia, unspecified: Secondary | ICD-10-CM

## 2022-11-12 DIAGNOSIS — N183 Chronic kidney disease, stage 3 unspecified: Secondary | ICD-10-CM

## 2022-11-12 DIAGNOSIS — Z1329 Encounter for screening for other suspected endocrine disorder: Secondary | ICD-10-CM

## 2022-11-12 LAB — CBC WITH DIFFERENTIAL/PLATELET
Absolute Monocytes: 358 cells/uL (ref 200–950)
Basophils Absolute: 51 cells/uL (ref 0–200)
Eosinophils Absolute: 131 cells/uL (ref 15–500)
HCT: 37.7 % (ref 35.0–45.0)
MCV: 84.5 fL (ref 80.0–100.0)
Monocytes Relative: 4.9 %
Neutro Abs: 3365 cells/uL (ref 1500–7800)
Neutrophils Relative %: 46.1 %
WBC: 7.3 10*3/uL (ref 3.8–10.8)

## 2022-11-12 LAB — POCT GLYCOSYLATED HEMOGLOBIN (HGB A1C): Hemoglobin A1C: 5.9 % — AB (ref 4.0–5.6)

## 2022-11-12 MED ORDER — ONDANSETRON 8 MG PO TBDP: 8.0000 mg | ORAL_TABLET | Freq: Three times a day (TID) | ORAL | 3 refills | Status: DC | PRN

## 2022-11-12 MED ORDER — METHYLPREDNISOLONE SODIUM SUCC 125 MG IJ SOLR
125.0000 mg | Freq: Once | INTRAMUSCULAR | Status: AC
Start: 2022-11-12 — End: 2022-11-12
  Administered 2022-11-12: 125 mg via INTRAMUSCULAR

## 2022-11-12 MED ORDER — TRULICITY 1.5 MG/0.5ML ~~LOC~~ SOAJ
1.5000 mg | SUBCUTANEOUS | 0 refills | Status: DC
Start: 2022-11-12 — End: 2023-01-13

## 2022-11-12 MED ORDER — REPATHA SURECLICK 140 MG/ML ~~LOC~~ SOAJ: SUBCUTANEOUS | 4 refills | Status: DC

## 2022-11-12 MED ORDER — SYNJARDY XR 10-1000 MG PO TB24
1.0000 | ORAL_TABLET | Freq: Every day | ORAL | 0 refills | Status: DC
Start: 2022-11-12 — End: 2023-02-18

## 2022-11-12 NOTE — Progress Notes (Signed)
   Established Patient Office Visit  Subjective   Patient ID: Renee Wade, female    DOB: 23-Mar-1961  Age: 62 y.o. MRN: [id number]  No chief complaint on file.   HPI    ROS    Objective:     BP 128/72   Pulse 78   Wt 251 lb (113.9 kg)   SpO2 99%   BMI 37.07 kg/m    Physical Exam   Results for orders placed or performed in visit on 11/12/22  POCT HgB A1C  Result Value Ref Range   Hemoglobin A1C 5.9 (A) 4.0 - 5.6 %   HbA1c POC (<> result, manual entry)     HbA1c, POC (prediabetic range)     HbA1c, POC (controlled diabetic range)        The ASCVD Risk score (Arnett DK, et al., 2019) failed to calculate for the following reasons:   The valid total cholesterol range is 130 to 320 mg/dL    Assessment & Plan:   Problem List Items Addressed This Visit       Unprioritized   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) - Primary   Relevant Medications   Empagliflozin-metFORMIN HCl ER (SYNJARDY XR) 03-999 MG TB24   Dulaglutide (TRULICITY) 1.5 MG/0.5ML SOPN   Other Relevant Orders   POCT HgB A1C (Completed)   COMPLETE METABOLIC PANEL WITH GFR   CBC w/Diff/Platelet   Dyslipidemia, goal LDL below 70   Relevant Medications   Evolocumab (REPATHA SURECLICK) 140 MG/ML SOAJ   Other Relevant Orders   Lipid Panel w/reflex Direct LDL   CBC w/Diff/Platelet   Other Visit Diagnoses     Thyroid disorder screen       Relevant Orders   TSH   Encounter for vitamin deficiency screening       Relevant Orders   VITAMIN D 25 Hydroxy (Vit-D Deficiency, Fractures)       No follow-ups on file.    Tandy Gaw, PA-C

## 2022-11-12 NOTE — Progress Notes (Signed)
Established Patient Office Visit  Subjective   Patient ID: Renee Wade, female    DOB: 05-Nov-1960  Age: 62 y.o. MRN: [id number]  Chief Complaint  Patient presents with   Follow-up    [name] a 62 yo obese female with T2DM, HTN, HLD, GERD, CKD who presents to the clinic for 3 month follow up.   Patient is recovering from URI and still feeling fatigued from tax season and admits to poor diet and limited activity. Patient compliant with medications. She does not check sugars. Denies any hypoglycemic symptoms. Denies open wounds, sores, and hypoglycemix events. Does no check BG. Denies CP, HA, vision changes. Eye exam scheduled for this fall. Patient does complain of prolonged coughing following URI despite completed tx with azithromycin. Requests steroid IM.  .. 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) 11/13/2016   Dyslipidemia, goal LDL below 70 11/14/2016   Lower extremity edema 03/17/2018   Statin declined 04/05/2018   Acute idiopathic gout of left foot 05/28/2018   Menopausal symptoms 02/28/2019   Bacterial vaginosis 10/16/2020   Epigastric pain 10/17/2020   Infected olecranon bursa, left 10/02/2021   Prolonged severe nausea and vomiting 10/16/2021   Right flank pain 10/16/2021   Nausea 01/15/2022   Microalbuminuria 01/15/2022   Statin myopathy 04/23/2022   Lumbar spondylosis 05/20/2022   Urinary frequency 09/30/2022   Resolved Ambulatory Problems     Diagnosis Date Noted   Class 2 severe obesity due to excess calories with serious comorbidity and body mass index (BMI) of 39.0 to 39.9 in adult (HCC) 12/19/2015   BMI 39.0-39.9,adult 02/01/2017   Past Medical History:  Diagnosis Date   Depression    H/O DVT    Hypertension    Obesity      ROS   See HPI.  Objective:     BP 128/72   Pulse 78   Wt 113.9 kg   SpO2 99%   BMI 37.07 kg/m  BP Readings from Last 3 Encounters:  11/12/22 128/72  11/03/22 99/67  08/13/22 (!) 107/58   Wt Readings from Last 3 Encounters:  11/12/22 251 lb (113.9 kg)  11/03/22 247 lb (112 kg)  09/30/22 253 lb (114.8 kg)      Physical Exam Constitutional:      Appearance: Normal appearance. She is obese.  HENT:     Head: Normocephalic.  Cardiovascular:     Rate and Rhythm: Normal rate and regular rhythm.  Pulmonary:     Effort: Pulmonary effort is normal.     Breath sounds: Normal breath sounds.  Musculoskeletal:     Right lower leg: No edema.     Left lower leg: No edema.  Neurological:     General: No focal deficit present.     Mental Status: She is alert.  Psychiatric:        Mood and Affect: Mood normal.      Results for orders placed or performed in visit on 11/12/22  POCT HgB  A1C  Result Value Ref Range   Hemoglobin A1C 5.9 (A) 4.0 - 5.6 %   HbA1c POC (<> result, manual entry)     HbA1c, POC (prediabetic range)     HbA1c, POC (controlled diabetic range)       Assessment & Plan:  Marland [name] KitchenNoellia was seen today for follow-up.  Diagnoses and all orders for this visit:  Cough, unspecified type -     methylPREDNISolone sodium succinate (SOLU-MEDROL) 125 mg/2 mL injection 125 mg  Dyslipidemia, goal LDL below 70 -     Evolocumab (REPATHA SURECLICK) 140 MG/ML SOAJ; INJECT 140MG  INTO THE SKIN EVERY 14 DAYS -     Lipid Panel w/reflex Direct LDL -     CBC w/Diff/Platelet  Type 2 diabetes mellitus with stage 3a chronic kidney disease, without long-term current use of insulin  (HCC) -     POCT HgB A1C -     COMPLETE METABOLIC PANEL WITH GFR -     CBC w/Diff/Platelet  Type 2 diabetes mellitus with stage 3 chronic kidney disease, without long-term current use of insulin, unspecified whether stage 3a or 3b CKD (HCC) -     Empagliflozin-metFORMIN HCl ER (SYNJARDY XR) 03-999 MG TB24; Take 1 tablet by mouth daily. -     Dulaglutide (TRULICITY) 1.5 MG/0.5ML SOPN; Inject 1.5 mg into the skin once a week.  Thyroid disorder screen -     TSH  Encounter for vitamin deficiency screening -     VITAMIN D 25 Hydroxy (Vit-D Deficiency, Fractures)  Other orders -     ondansetron (ZOFRAN-ODT) 8 MG disintegrating tablet; Take 1 tablet (8 mg total) by mouth every 8 (eight) hours as needed for nausea.   A1c is within goal Will continue Trulicity and Synjardy Refills sent BP to goal, continuing benicar Eye exam schedule for fall Foot exam completed and WNL Solumedrol IM admin for prolonged cough. Instructed to contact office if worsens.   Return in about 3 months (around 02/12/2023).

## 2022-11-12 NOTE — Patient Instructions (Signed)
Cough, Adult Coughing is a reflex that clears your throat and airways (respiratory system). It helps heal and protect your lungs. It is normal to cough from time to time. A cough that happens with other symptoms or that lasts a long time may be a sign of a condition that needs treatment. A short-term (acute) cough may only last 2-3 weeks. A long-term (chronic) cough may last 8 or more weeks. Coughing is often caused by: Diseases, such as: An infection of the respiratory system. Asthma or other heart or lung diseases. Gastroesophageal reflux. This is when acid comes back up from the stomach. Breathing in things that irritate your lungs. Allergies. Postnasal drip. This is when mucus runs down the back of your throat. Smoking. Some medicines. Follow these instructions at home: Medicines Take over-the-counter and prescription medicines only as told by your health care provider. Talk with your provider before you take cough medicine (cough suppressants). Eating and drinking Do not drink alcohol. Avoid caffeine. Drink enough fluid to keep your pee (urine) pale yellow. Lifestyle Avoid cigarette smoke. Do not use any products that contain nicotine or tobacco. These products include cigarettes, chewing tobacco, and vaping devices, such as e-cigarettes. If you need help quitting, ask your provider. Avoid things that make you cough. These may include perfumes, candles, cleaning products, or campfire smoke. General instructions  Watch for any changes to your cough. Tell your provider about them. Always cover your mouth when you cough. If the air is dry in your bedroom or home, use a cool mist vaporizer or humidifier. If your cough is worse at night, try to sleep in a semi-upright position. Rest as needed. Contact a health care provider if: You have new symptoms, or your symptoms get worse. You cough up pus. You have a fever that does not go away or a cough that does not get better after 2-3  weeks. You cannot control your cough with medicine, and you are losing sleep. You have pain that gets worse or is not helped with medicine. You lose weight for no clear reason. You have night sweats. Get help right away if: You cough up blood. You have trouble breathing. Your heart is beating very fast. These symptoms may be an emergency. Get help right away. Call 911. Do not wait to see if the symptoms will go away. Do not drive yourself to the hospital. This information is not intended to replace advice given to you by your health care provider. Make sure you discuss any questions you have with your health care provider. Document Revised: 01/24/2022 Document Reviewed: 01/24/2022 Elsevier Patient Education  2024 Elsevier Inc.  

## 2022-11-13 LAB — COMPLETE METABOLIC PANEL WITH GFR
AG Ratio: 1.8 (calc) (ref 1.0–2.5)
ALT: 20 U/L (ref 6–29)
AST: 17 U/L (ref 10–35)
Albumin: 4.2 g/dL (ref 3.6–5.1)
Alkaline phosphatase (APISO): 63 U/L (ref 37–153)
BUN/Creatinine Ratio: 12 (calc) (ref 6–22)
BUN: 18 mg/dL (ref 7–25)
CO2: 23 mmol/L (ref 20–32)
Calcium: 10.2 mg/dL (ref 8.6–10.4)
Chloride: 105 mmol/L (ref 98–110)
Creat: 1.51 mg/dL — ABNORMAL HIGH (ref 0.50–1.05)
Globulin: 2.4 g/dL (calc) (ref 1.9–3.7)
Glucose, Bld: 124 mg/dL — ABNORMAL HIGH (ref 65–99)
Potassium: 4.1 mmol/L (ref 3.5–5.3)
Sodium: 139 mmol/L (ref 135–146)
Total Bilirubin: 0.5 mg/dL (ref 0.2–1.2)
Total Protein: 6.6 g/dL (ref 6.1–8.1)
eGFR: 39 mL/min/{1.73_m2} — ABNORMAL LOW (ref >=60)

## 2022-11-13 LAB — LIPID PANEL W/REFLEX DIRECT LDL
Cholesterol: 128 mg/dL (ref <200)
HDL: 61 mg/dL (ref >=50)
LDL Cholesterol (Calc): 48 mg/dL (calc)
Non-HDL Cholesterol (Calc): 67 mg/dL (calc) (ref <130)
Total CHOL/HDL Ratio: 2.1 (calc) (ref <5.0)
Triglycerides: 102 mg/dL (ref <150)

## 2022-11-13 LAB — CBC WITH DIFFERENTIAL/PLATELET
Basophils Relative: 0.7 %
Eosinophils Relative: 1.8 %
Hemoglobin: 11.9 g/dL (ref 11.7–15.5)
Lymphs Abs: 3395 cells/uL (ref 850–3900)
MCH: 26.7 pg — ABNORMAL LOW (ref 27.0–33.0)
MCHC: 31.6 g/dL — ABNORMAL LOW (ref 32.0–36.0)
MPV: 9.8 fL (ref 7.5–12.5)
Platelets: 428 10*3/uL — ABNORMAL HIGH (ref 140–400)
RBC: 4.46 10*6/uL (ref 3.80–5.10)
RDW: 14.5 % (ref 11.0–15.0)
Total Lymphocyte: 46.5 %

## 2022-11-13 LAB — TSH: TSH: 2.23 mIU/L (ref 0.40–4.50)

## 2022-11-13 LAB — VITAMIN D 25 HYDROXY (VIT D DEFICIENCY, FRACTURES): Vit D, 25-Hydroxy: 33 ng/mL (ref 30–100)

## 2022-11-14 NOTE — Progress Notes (Signed)
Renee Wade,   Vitamin D still low normal. Take 2000 to 5000 units a day with dairy.  Thyroid looks great.  LDL to goal.  Platelets elevated. Make sure to stay on ASA 81mg  daily.  Kidney function stable. Avoid any oral NSAIDs. Stay hydrated.

## 2022-12-05 ENCOUNTER — Encounter: Payer: Self-pay | Admitting: Physician Assistant

## 2022-12-08 ENCOUNTER — Telehealth: Payer: Self-pay

## 2022-12-08 NOTE — Telephone Encounter (Signed)
Initiated Prior authorization [id number] SureClick 140MG /ML auto-injectors Via: Covermymeds Case/Key: [id number] Status: approved as of 12/10/22 Reason: good through 12/09/23 Notified Pt via: Mychart

## 2022-12-21 ENCOUNTER — Other Ambulatory Visit: Payer: Self-pay | Admitting: Physician Assistant

## 2022-12-21 ENCOUNTER — Other Ambulatory Visit: Payer: Self-pay | Admitting: Family Medicine

## 2022-12-21 DIAGNOSIS — K2100 Gastro-esophageal reflux disease with esophagitis, without bleeding: Secondary | ICD-10-CM

## 2022-12-21 DIAGNOSIS — F411 Generalized anxiety disorder: Secondary | ICD-10-CM

## 2022-12-26 NOTE — Telephone Encounter (Signed)
Routing to covering provider  Last OV: 11/12/22 Next OV: 02/18/23 Last RF: 06/24/22

## 2023-01-10 ENCOUNTER — Other Ambulatory Visit: Payer: Self-pay | Admitting: Physician Assistant

## 2023-01-10 DIAGNOSIS — N183 Chronic kidney disease, stage 3 unspecified: Secondary | ICD-10-CM

## 2023-01-12 ENCOUNTER — Other Ambulatory Visit: Payer: Self-pay | Admitting: Physician Assistant

## 2023-01-12 DIAGNOSIS — Z1231 Encounter for screening mammogram for malignant neoplasm of breast: Secondary | ICD-10-CM

## 2023-01-20 ENCOUNTER — Other Ambulatory Visit (HOSPITAL_BASED_OUTPATIENT_CLINIC_OR_DEPARTMENT_OTHER): Payer: Self-pay

## 2023-02-18 ENCOUNTER — Encounter: Payer: Self-pay | Admitting: Physician Assistant

## 2023-02-18 ENCOUNTER — Ambulatory Visit: Payer: BC Managed Care – PPO | Admitting: Physician Assistant

## 2023-02-18 VITALS — BP 123/71 | HR 71 | Ht 69.0 in | Wt 251.0 lb

## 2023-02-18 DIAGNOSIS — Z7984 Long term (current) use of oral hypoglycemic drugs: Secondary | ICD-10-CM

## 2023-02-18 DIAGNOSIS — N1831 Chronic kidney disease, stage 3a: Secondary | ICD-10-CM

## 2023-02-18 DIAGNOSIS — E1122 Type 2 diabetes mellitus with diabetic chronic kidney disease: Secondary | ICD-10-CM

## 2023-02-18 DIAGNOSIS — G720 Drug-induced myopathy: Secondary | ICD-10-CM

## 2023-02-18 DIAGNOSIS — B3731 Acute candidiasis of vulva and vagina: Secondary | ICD-10-CM | POA: Diagnosis not present

## 2023-02-18 DIAGNOSIS — N183 Chronic kidney disease, stage 3 unspecified: Secondary | ICD-10-CM

## 2023-02-18 DIAGNOSIS — T466X5A Adverse effect of antihyperlipidemic and antiarteriosclerotic drugs, initial encounter: Secondary | ICD-10-CM

## 2023-02-18 LAB — POCT GLYCOSYLATED HEMOGLOBIN (HGB A1C): Hemoglobin A1C: 6.4 % — AB (ref 4.0–5.6)

## 2023-02-18 MED ORDER — TRULICITY 1.5 MG/0.5ML ~~LOC~~ SOAJ
1.5000 mg | SUBCUTANEOUS | 0 refills | Status: DC
Start: 2023-02-18 — End: 2023-05-20

## 2023-02-18 MED ORDER — FLUCONAZOLE 150 MG PO TABS
150.0000 mg | ORAL_TABLET | Freq: Once | ORAL | 0 refills | Status: AC
Start: 2023-02-18 — End: 2023-02-18

## 2023-02-18 MED ORDER — SYNJARDY XR 10-1000 MG PO TB24
1.0000 | ORAL_TABLET | Freq: Every day | ORAL | 0 refills | Status: DC
Start: 2023-02-18 — End: 2023-05-20

## 2023-02-18 NOTE — Progress Notes (Signed)
Established Patient Office Visit  Subjective   Patient ID: Renee Wade, female    DOB: Oct 09, 1960  Age: 62 y.o. MRN: [id number]  Chief Complaint  Patient presents with   Medical Management of Chronic Issues    Last A1c was 5.9    HPI [name] a 62 yo obese female who presents to the clinic for follow up on T2DM, HTN, HLD.   She is doing ok. She is not checking sugars. She is not monitoring what she is eating. She is active but no exercise. She is worried about her dad and possible lung cancer dx. No CP, palpitations, headaches or vision changes. She is having to use her klonapin at bedtime more.    Recent augmentin was taken for tooth infection. Having yeast symptoms since. Would like diflucan.  .. 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) 11/13/2016   Dyslipidemia, goal LDL below 70 11/14/2016   Lower extremity edema 03/17/2018   Statin declined 04/05/2018   Acute idiopathic gout of left foot 05/28/2018   Menopausal symptoms 02/28/2019   Bacterial vaginosis 10/16/2020   Epigastric pain 10/17/2020   Infected olecranon bursa, left 10/02/2021   Prolonged severe nausea and vomiting 10/16/2021   Right flank pain 10/16/2021   Nausea 01/15/2022   Microalbuminuria 01/15/2022   Statin myopathy 04/23/2022   Lumbar spondylosis 05/20/2022   Urinary frequency 09/30/2022   Resolved Ambulatory Problems    Diagnosis Date Noted   Class 2 severe obesity due to  excess calories with serious comorbidity and body mass index (BMI) of 39.0 to 39.9 in adult (HCC) 12/19/2015   BMI 39.0-39.9,adult 02/01/2017   Past Medical History:  Diagnosis Date   Depression    H/O DVT    Hypertension    Obesity      ROS   See HPI.  Objective:     BP 123/71   Pulse 71   Ht 5\' 9"  (1.753 m)   Wt 251 lb (113.9 kg)   SpO2 99%   BMI 37.07 kg/m  BP Readings from Last 3 Encounters:  02/18/23 123/71  11/12/22 128/72  11/03/22 99/67   Wt Readings from Last 3 Encounters:  02/18/23 251 lb (113.9 kg)  11/12/22 251 lb (113.9 kg)  11/03/22 247 lb (112 kg)    ..    11/12/2022    7:37 AM 01/15/2022    7:22 AM 10/02/2021    7:38 AM 07/03/2021    7:49 AM 10/16/2020   10:17 AM  Depression screen PHQ 2/9  Decreased Interest 0 1 1 0 1  Down, Depressed, Hopeless 0 0 0 1 1  PHQ - 2 Score 0 1 1 1 2   Altered sleeping     3  Tired, decreased energy     2  Change in appetite     3  Feeling bad or failure about yourself      0  Trouble concentrating     1  Moving slowly or fidgety/restless  0  Suicidal thoughts     0  PHQ-9 Score     11  Difficult doing work/chores     Somewhat difficult   .Marland Kitchen Results for orders placed or performed in visit on 02/18/23  POCT HgB A1C  Result Value Ref Range   Hemoglobin A1C 6.4 (A) 4.0 - 5.6 %   HbA1c POC (<> result, manual entry)     HbA1c, POC (prediabetic range)     HbA1c, POC (controlled diabetic range)       Physical Exam Constitutional:      Appearance: Normal appearance.  HENT:     Head: Normocephalic.  Cardiovascular:     Rate and Rhythm: Normal rate and regular rhythm.     Heart sounds: Normal heart sounds.  Pulmonary:     Effort: Pulmonary effort is normal.     Breath sounds: Normal breath sounds.  Musculoskeletal:     Right lower leg: No edema.     Left lower leg: No edema.  Neurological:     General: No focal deficit present.     Mental Status: She is alert and oriented to person, place, and time.   Psychiatric:        Mood and Affect: Mood normal.         Assessment & Plan:  Marland [name] KitchenRinaldo Cloud "[name]" was seen today for medical management of chronic issues.  Diagnoses and all orders for this visit:  Type 2 diabetes mellitus with stage 3 chronic kidney disease, without long-term current use of insulin, unspecified whether stage 3a or 3b CKD (HCC) -     POCT HgB A1C -     Empagliflozin-metFORMIN HCl ER (SYNJARDY XR) 03-999 MG TB24; Take 1 tablet by mouth daily. -     Dulaglutide (TRULICITY) 1.5 MG/0.5ML SOPN; Inject 1.5 mg into the skin once a week.  Type 2 diabetes mellitus with stage 3a chronic kidney disease, without long-term current use of insulin (HCC) -     Cancel: POCT Urine Albumin/Creatinine with ratio [[id number]]  Yeast vaginitis -     fluconazole (DIFLUCAN) 150 MG tablet; Take 1 tablet (150 mg total) by mouth once for 1 dose.  Statin myopathy   A1C up  For now stay on same medications Continue to work on diet and exercise BP to goal On repatha, statin intolerant Discussed need for eye exam Foot exam UTD Declined all vaccines Follow up in 3 months  Return in about 3 months (around 05/20/2023).    Tandy Gaw, PA-C

## 2023-03-10 DIAGNOSIS — G43B0 Ophthalmoplegic migraine, not intractable: Secondary | ICD-10-CM | POA: Diagnosis not present

## 2023-03-10 DIAGNOSIS — H25813 Combined forms of age-related cataract, bilateral: Secondary | ICD-10-CM | POA: Diagnosis not present

## 2023-03-10 DIAGNOSIS — E119 Type 2 diabetes mellitus without complications: Secondary | ICD-10-CM | POA: Diagnosis not present

## 2023-03-10 LAB — HM DIABETES EYE EXAM

## 2023-03-20 ENCOUNTER — Encounter: Payer: Self-pay | Admitting: Physician Assistant

## 2023-03-26 DIAGNOSIS — Z1231 Encounter for screening mammogram for malignant neoplasm of breast: Secondary | ICD-10-CM | POA: Diagnosis not present

## 2023-03-26 DIAGNOSIS — R92323 Mammographic fibroglandular density, bilateral breasts: Secondary | ICD-10-CM | POA: Diagnosis not present

## 2023-03-26 LAB — HM MAMMOGRAPHY

## 2023-03-31 ENCOUNTER — Other Ambulatory Visit: Payer: Self-pay | Admitting: Physician Assistant

## 2023-03-31 DIAGNOSIS — I10 Essential (primary) hypertension: Secondary | ICD-10-CM

## 2023-04-14 ENCOUNTER — Other Ambulatory Visit: Payer: Self-pay | Admitting: Physician Assistant

## 2023-04-14 ENCOUNTER — Other Ambulatory Visit: Payer: Self-pay | Admitting: Family Medicine

## 2023-04-14 DIAGNOSIS — F411 Generalized anxiety disorder: Secondary | ICD-10-CM

## 2023-04-16 NOTE — Telephone Encounter (Signed)
Prescription Request  04/16/2023  LOV: Visit date not found  What is the name of the medication or equipment? clonazePAM (KLONOPIN) 0.5 MG tablet [[id number]]   Have you contacted your pharmacy to request a refill? Yes   Which pharmacy would you like this sent to?  [facility] - [place], [place] - [address] [place] [address] [place] [address] Phone: [phone] Fax: [phone]   Patient notified that their request is being sent to the clinical staff for review and that they should receive a response within 2 business days.   Please advise at Mobile [phone] (mobile)

## 2023-04-17 ENCOUNTER — Encounter: Payer: Self-pay | Admitting: Physician Assistant

## 2023-04-17 NOTE — Telephone Encounter (Signed)
Last fill 12/29/22

## 2023-04-19 ENCOUNTER — Other Ambulatory Visit: Payer: Self-pay | Admitting: Physician Assistant

## 2023-04-19 DIAGNOSIS — F411 Generalized anxiety disorder: Secondary | ICD-10-CM

## 2023-04-20 ENCOUNTER — Encounter: Payer: Self-pay | Admitting: Physician Assistant

## 2023-04-20 ENCOUNTER — Telehealth: Payer: Self-pay | Admitting: Physician Assistant

## 2023-04-20 MED ORDER — FLUCONAZOLE 150 MG PO TABS: 150.0000 mg | ORAL_TABLET | Freq: Once | ORAL | 0 refills | Status: AC

## 2023-04-20 NOTE — Telephone Encounter (Signed)
Patient put a message on mychart for refills on clonazepam 0.5mg  and she is asking if you could call her something in for a yeast infection patient can't come into office except on Thursday due to family issues with her father being ill   Please advise

## 2023-04-22 ENCOUNTER — Ambulatory Visit: Payer: BC Managed Care – PPO | Admitting: Physician Assistant

## 2023-05-20 ENCOUNTER — Ambulatory Visit: Payer: BC Managed Care – PPO | Admitting: Physician Assistant

## 2023-05-20 ENCOUNTER — Encounter: Payer: Self-pay | Admitting: Physician Assistant

## 2023-05-20 VITALS — BP 129/60 | Resp 12 | Ht 69.0 in | Wt 251.0 lb

## 2023-05-20 DIAGNOSIS — N1832 Chronic kidney disease, stage 3b: Secondary | ICD-10-CM | POA: Diagnosis not present

## 2023-05-20 DIAGNOSIS — G720 Drug-induced myopathy: Secondary | ICD-10-CM | POA: Diagnosis not present

## 2023-05-20 DIAGNOSIS — E785 Hyperlipidemia, unspecified: Secondary | ICD-10-CM

## 2023-05-20 DIAGNOSIS — E1122 Type 2 diabetes mellitus with diabetic chronic kidney disease: Secondary | ICD-10-CM | POA: Diagnosis not present

## 2023-05-20 DIAGNOSIS — T466X5A Adverse effect of antihyperlipidemic and antiarteriosclerotic drugs, initial encounter: Secondary | ICD-10-CM

## 2023-05-20 DIAGNOSIS — F411 Generalized anxiety disorder: Secondary | ICD-10-CM

## 2023-05-20 DIAGNOSIS — K2100 Gastro-esophageal reflux disease with esophagitis, without bleeding: Secondary | ICD-10-CM

## 2023-05-20 DIAGNOSIS — N1831 Chronic kidney disease, stage 3a: Secondary | ICD-10-CM | POA: Diagnosis not present

## 2023-05-20 DIAGNOSIS — I10 Essential (primary) hypertension: Secondary | ICD-10-CM | POA: Diagnosis not present

## 2023-05-20 DIAGNOSIS — R110 Nausea: Secondary | ICD-10-CM

## 2023-05-20 DIAGNOSIS — Z7984 Long term (current) use of oral hypoglycemic drugs: Secondary | ICD-10-CM

## 2023-05-20 LAB — POCT GLYCOSYLATED HEMOGLOBIN (HGB A1C): Hemoglobin A1C: 6.3 % — AB (ref 4.0–5.6)

## 2023-05-20 MED ORDER — PANTOPRAZOLE SODIUM 40 MG PO TBEC: 40.0000 mg | DELAYED_RELEASE_TABLET | Freq: Two times a day (BID) | ORAL | 3 refills | Status: DC

## 2023-05-20 MED ORDER — CLONAZEPAM 0.5 MG PO TABS: 0.5000 mg | ORAL_TABLET | Freq: Two times a day (BID) | ORAL | 1 refills | Status: DC | PRN

## 2023-05-20 MED ORDER — OLMESARTAN MEDOXOMIL-HCTZ 20-12.5 MG PO TABS: 1.0000 | ORAL_TABLET | Freq: Every day | ORAL | 1 refills | Status: DC

## 2023-05-20 MED ORDER — SYNJARDY XR 10-1000 MG PO TB24: 1.0000 | ORAL_TABLET | Freq: Every day | ORAL | 0 refills | Status: DC

## 2023-05-20 MED ORDER — TRULICITY 1.5 MG/0.5ML ~~LOC~~ SOAJ: 1.5000 mg | SUBCUTANEOUS | 0 refills | Status: DC

## 2023-05-20 MED ORDER — ONDANSETRON HCL 4 MG PO TABS: 4.0000 mg | ORAL_TABLET | Freq: Three times a day (TID) | ORAL | 2 refills | Status: AC | PRN

## 2023-05-20 NOTE — Progress Notes (Signed)
Established Patient Office Visit  Subjective   Patient ID: Renee Wade, female    DOB: 10-25-1960  Age: 62 y.o. MRN: [id number]  Chief Complaint  Patient presents with   Medical Management of Chronic Issues   Diabetes   Medication Refill    Diabetes Pertinent negatives for diabetes include no chest pain.  Medication Refill Associated symptoms include nausea. Pertinent negatives include no chest pain.   62 yo female presents today for 3 mo diabetic f/u. Her last A1c was 6.4. No concerns today. She is staying active and eating less. She denies any hypoglycemic events. Pt not checking sugars. She is compliant with her medications. Denies any CP, palpitations, headaches or vision changes.   She feels nauseous due to eating bad food this weekend.   Needs all medications refilled.   Review of Systems  Respiratory:  Negative for shortness of breath.   Cardiovascular:  Negative for chest pain.  Gastrointestinal:  Positive for nausea.      Objective:     BP 129/60 (BP Location: Left Arm, Patient Position: Sitting)   Resp 12   Ht 5\' 9"  (1.753 m)   Wt 251 lb (113.9 kg)   BMI 37.07 kg/m    Physical Exam Constitutional:      Appearance: Normal appearance. She is obese.  HENT:     Head: Normocephalic.  Cardiovascular:     Rate and Rhythm: Normal rate and regular rhythm.     Pulses: Normal pulses.     Heart sounds: Normal heart sounds.  Pulmonary:     Effort: Pulmonary effort is normal.     Breath sounds: Normal breath sounds.  Musculoskeletal:     Cervical back: Normal range of motion and neck supple.  Lymphadenopathy:     Cervical: No cervical adenopathy.  Neurological:     General: No focal deficit present.     Mental Status: She is alert and oriented to person, place, and time.  Psychiatric:        Mood and Affect: Mood normal.      .. Results for orders placed or performed in visit on 05/20/23  POCT HgB A1C  Result Value Ref Range   Hemoglobin A1C  6.3 (A) 4.0 - 5.6 %   HbA1c POC (<> result, manual entry)     HbA1c, POC (prediabetic range)     HbA1c, POC (controlled diabetic range)          Assessment & Plan:  Renee [name] KitchenRinaldo Wade "Renee Wade" was seen today for medical management of chronic issues, diabetes and medication refill.  Diagnoses and all orders for this visit:  Type 2 diabetes mellitus with stage 3b chronic kidney disease, without long-term current use of insulin (HCC) -     CMP14+EGFR -     Empagliflozin-metFORMIN HCl ER (SYNJARDY XR) 03-999 MG TB24; Take 1 tablet by mouth daily. -     Dulaglutide (TRULICITY) 1.5 MG/0.5ML SOAJ; Inject 1.5 mg into the skin once a week. -     olmesartan-hydrochlorothiazide (BENICAR HCT) 20-12.5 MG tablet; Take 1 tablet by mouth daily. -     POCT HgB A1C  Dyslipidemia, goal LDL below 70  Statin myopathy  Essential hypertension, benign -     olmesartan-hydrochlorothiazide (BENICAR HCT) 20-12.5 MG tablet; Take 1 tablet by mouth daily.  Gastroesophageal reflux disease with esophagitis without hemorrhage -     pantoprazole (PROTONIX) 40 MG tablet; Take 1 tablet (40 mg total) by mouth 2 (two) times daily.  Anxiety state -  clonazePAM (KLONOPIN) 0.5 MG tablet; Take 1 tablet (0.5 mg total) by mouth 2 (two) times daily as needed. for anxiety  Stage 3b chronic kidney disease (HCC)  Nausea -     ondansetron (ZOFRAN) 4 MG tablet; Take 1 tablet (4 mg total) by mouth every 8 (eight) hours as needed for nausea or vomiting.    A1c 6.3- to goal, trending downward. Patient on Synjardy and trulicity- continue these medications.  Refilled medications. Zofran as needed for nausea  BP well controlled on olmesartan-hydrochlorothiazide.  CMP ordered for diabetic kidney eval. Declined urine microalbumin. On repatha, statin intolerant. Declined COVID vaccine today. Eye and foot exam UTD. Referral for mammogram. F/u in 3 months.  Return in about 3 months (around 08/18/2023).    Tandy Gaw,  PA-C

## 2023-05-21 LAB — CMP14+EGFR
ALT: 23 [IU]/L (ref 0–32)
AST: 20 [IU]/L (ref 0–40)
Albumin: 4.3 g/dL (ref 3.9–4.9)
Alkaline Phosphatase: 74 [IU]/L (ref 44–121)
BUN/Creatinine Ratio: 9 — ABNORMAL LOW (ref 12–28)
BUN: 16 mg/dL (ref 8–27)
Bilirubin Total: 0.9 mg/dL (ref 0.0–1.2)
CO2: 23 mmol/L (ref 20–29)
Calcium: 10.5 mg/dL — ABNORMAL HIGH (ref 8.7–10.3)
Chloride: 103 mmol/L (ref 96–106)
Creatinine, Ser: 1.69 mg/dL — ABNORMAL HIGH (ref 0.57–1.00)
Globulin, Total: 2.5 g/dL (ref 1.5–4.5)
Glucose: 95 mg/dL (ref 70–99)
Potassium: 4.2 mmol/L (ref 3.5–5.2)
Sodium: 141 mmol/L (ref 134–144)
Total Protein: 6.8 g/dL (ref 6.0–8.5)
eGFR: 34 mL/min/{1.73_m2} — ABNORMAL LOW (ref >59)

## 2023-05-22 NOTE — Progress Notes (Signed)
Renee Wade,   Your kidney function dropped a little. Make sure you are eating as best you can for chronic kidney disease. There are lots of online resources. Limiting sodium and processed foods are the best way to start. Avoid oral anti-inflammatories. Your BP looked great but keeping under 130/80. You are on 2 kidney protectant medications as well. Will recheck in 3 months.

## 2023-06-11 ENCOUNTER — Other Ambulatory Visit: Payer: Self-pay | Admitting: Physician Assistant

## 2023-06-11 DIAGNOSIS — F411 Generalized anxiety disorder: Secondary | ICD-10-CM

## 2023-06-12 ENCOUNTER — Telehealth: Payer: Self-pay

## 2023-06-12 NOTE — Telephone Encounter (Signed)
 Renee Wade is very upset. She states all her medications went to [facility] instead of [facility]. She did have them transferred to [facility] pharmacy. She states [facility] gave out 2 of her medications to another person. She has it straightened out. She just wanted [name] to know so it doesn't happen in the future.

## 2023-08-18 NOTE — Progress Notes (Signed)
   Established Patient Office Visit  Subjective   Patient ID: Renee Wade, female    DOB: November 20, 1960  Age: 63 y.o. MRN: [id number]  CC: T2DM follow up   HPI Patient is a 63 yo female with CKD, anxiety, HTN, GERD, dyslipidemia, and T2DM who presents today for 3 mo diabetic f/u.  Her last A1c was 6.3%. She is staying active and eating less. She denies any hypoglycemic events. She is not checking her sugars at home.  She is compliant with her medications. Denies any CP, palpitations, headaches or vision changes.   .. 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) 11/13/2016   Dyslipidemia, goal LDL below 70 11/14/2016   Lower extremity edema 03/17/2018   Statin declined 04/05/2018   Acute idiopathic gout of left foot 05/28/2018   Menopausal symptoms 02/28/2019   Bacterial vaginosis 10/16/2020   Epigastric pain 10/17/2020   Infected olecranon bursa, left 10/02/2021   Prolonged severe nausea and vomiting 10/16/2021   Right flank pain 10/16/2021   Nausea 01/15/2022   Microalbuminuria 01/15/2022   Statin myopathy 04/23/2022   Lumbar spondylosis 05/20/2022   Urinary frequency 09/30/2022   Stage 3b chronic kidney disease (HCC) 05/20/2023   Resolved Ambulatory Problems    Diagnosis Date Noted   Class 2 severe obesity due to excess calories with serious comorbidity and body mass index (BMI) of 39.0 to 39.9 in adult (HCC) 12/19/2015   BMI 39.0-39.9,adult 02/01/2017    Past Medical History:  Diagnosis Date   Depression    H/O DVT    Hypertension    Obesity     ROS See HPI.    Objective:    There were no vitals taken for this visit. {Vitals History (Optional):23777}  Physical Exam Constitutional:      Appearance: She is obese.  HENT:     Head: Normocephalic and atraumatic.  Cardiovascular:     Rate and Rhythm: Normal rate and regular rhythm.     Pulses: Normal pulses.     Heart sounds: Normal heart sounds.  Pulmonary:     Effort: Pulmonary effort is normal.     Breath sounds: Normal breath sounds.  Skin:    General: Skin is warm.  Neurological:     Mental Status: She is alert.    Assessment & Plan:   - A1C due today  - Pneumococcal, COVID, Shingles vaccine due  - Colonoscopy, mammogram due  - DM kidney eval due    Ilean China, [address]

## 2023-08-19 ENCOUNTER — Encounter: Payer: Self-pay | Admitting: Physician Assistant

## 2023-08-19 ENCOUNTER — Ambulatory Visit: Payer: BC Managed Care – PPO | Admitting: Physician Assistant

## 2023-08-19 VITALS — BP 126/66 | HR 66 | Temp 98.7°F | Ht 69.0 in | Wt 247.0 lb

## 2023-08-19 DIAGNOSIS — N1832 Chronic kidney disease, stage 3b: Secondary | ICD-10-CM

## 2023-08-19 DIAGNOSIS — T466X5A Adverse effect of antihyperlipidemic and antiarteriosclerotic drugs, initial encounter: Secondary | ICD-10-CM

## 2023-08-19 DIAGNOSIS — G720 Drug-induced myopathy: Secondary | ICD-10-CM

## 2023-08-19 DIAGNOSIS — E785 Hyperlipidemia, unspecified: Secondary | ICD-10-CM | POA: Diagnosis not present

## 2023-08-19 DIAGNOSIS — E1122 Type 2 diabetes mellitus with diabetic chronic kidney disease: Secondary | ICD-10-CM

## 2023-08-19 DIAGNOSIS — N1831 Chronic kidney disease, stage 3a: Secondary | ICD-10-CM

## 2023-08-19 DIAGNOSIS — I10 Essential (primary) hypertension: Secondary | ICD-10-CM | POA: Diagnosis not present

## 2023-08-19 LAB — POCT GLYCOSYLATED HEMOGLOBIN (HGB A1C): Hemoglobin A1C: 6.1 % — AB (ref 4.0–5.6)

## 2023-08-19 MED ORDER — OLMESARTAN MEDOXOMIL-HCTZ 20-12.5 MG PO TABS: 1.0000 | ORAL_TABLET | Freq: Every day | ORAL | 1 refills | Status: DC

## 2023-08-19 MED ORDER — TRULICITY 1.5 MG/0.5ML ~~LOC~~ SOAJ: 1.5000 mg | SUBCUTANEOUS | 0 refills | Status: DC

## 2023-08-19 MED ORDER — FLUCONAZOLE 150 MG PO TABS: 150.0000 mg | ORAL_TABLET | Freq: Once | ORAL | 0 refills | Status: AC

## 2023-08-19 MED ORDER — SYNJARDY XR 10-1000 MG PO TB24: 1.0000 | ORAL_TABLET | Freq: Every day | ORAL | 0 refills | Status: DC

## 2023-08-19 NOTE — Patient Instructions (Signed)
 Mama look up depression med

## 2023-08-21 ENCOUNTER — Encounter: Payer: Self-pay | Admitting: Physician Assistant

## 2023-09-29 ENCOUNTER — Encounter: Payer: Self-pay | Admitting: Physician Assistant

## 2023-10-12 ENCOUNTER — Other Ambulatory Visit: Payer: Self-pay | Admitting: Physician Assistant

## 2023-10-12 DIAGNOSIS — F411 Generalized anxiety disorder: Secondary | ICD-10-CM

## 2023-10-14 NOTE — Telephone Encounter (Signed)
 Last OV: 08/1223 Next OV: 11/25/23 Last RF: 06/15/23

## 2023-11-23 ENCOUNTER — Other Ambulatory Visit: Payer: Self-pay | Admitting: Physician Assistant

## 2023-11-23 DIAGNOSIS — E785 Hyperlipidemia, unspecified: Secondary | ICD-10-CM

## 2023-11-24 DIAGNOSIS — H43811 Vitreous degeneration, right eye: Secondary | ICD-10-CM | POA: Diagnosis not present

## 2023-11-24 DIAGNOSIS — H25813 Combined forms of age-related cataract, bilateral: Secondary | ICD-10-CM | POA: Diagnosis not present

## 2023-11-24 DIAGNOSIS — G43B0 Ophthalmoplegic migraine, not intractable: Secondary | ICD-10-CM | POA: Diagnosis not present

## 2023-11-24 DIAGNOSIS — E119 Type 2 diabetes mellitus without complications: Secondary | ICD-10-CM | POA: Diagnosis not present

## 2023-11-25 ENCOUNTER — Ambulatory Visit: Admitting: Physician Assistant

## 2023-11-25 ENCOUNTER — Encounter: Payer: Self-pay | Admitting: Physician Assistant

## 2023-11-25 VITALS — BP 129/65 | HR 65 | Wt 254.0 lb

## 2023-11-25 DIAGNOSIS — Z7985 Long-term (current) use of injectable non-insulin antidiabetic drugs: Secondary | ICD-10-CM

## 2023-11-25 DIAGNOSIS — Z79899 Other long term (current) drug therapy: Secondary | ICD-10-CM | POA: Diagnosis not present

## 2023-11-25 DIAGNOSIS — N1832 Chronic kidney disease, stage 3b: Secondary | ICD-10-CM

## 2023-11-25 DIAGNOSIS — Z6837 Body mass index (BMI) 37.0-37.9, adult: Secondary | ICD-10-CM

## 2023-11-25 DIAGNOSIS — E1122 Type 2 diabetes mellitus with diabetic chronic kidney disease: Secondary | ICD-10-CM | POA: Diagnosis not present

## 2023-11-25 DIAGNOSIS — Z1211 Encounter for screening for malignant neoplasm of colon: Secondary | ICD-10-CM

## 2023-11-25 DIAGNOSIS — E785 Hyperlipidemia, unspecified: Secondary | ICD-10-CM | POA: Diagnosis not present

## 2023-11-25 DIAGNOSIS — E66812 Obesity, class 2: Secondary | ICD-10-CM

## 2023-11-25 DIAGNOSIS — Z7984 Long term (current) use of oral hypoglycemic drugs: Secondary | ICD-10-CM

## 2023-11-25 LAB — POCT UA - MICROALBUMIN
Albumin/Creatinine Ratio, Urine, POC: <30
Creatinine, POC: 300 mg/dL
Microalbumin Ur, POC: 30 mg/L

## 2023-11-25 LAB — POCT GLYCOSYLATED HEMOGLOBIN (HGB A1C): Hemoglobin A1C: 6.4 % — AB (ref 4.0–5.6)

## 2023-11-25 MED ORDER — TRULICITY 1.5 MG/0.5ML ~~LOC~~ SOAJ: 1.5000 mg | SUBCUTANEOUS | 0 refills | Status: DC

## 2023-11-25 MED ORDER — SYNJARDY XR 10-1000 MG PO TB24: 1.0000 | ORAL_TABLET | Freq: Every day | ORAL | 0 refills | Status: DC

## 2023-11-25 NOTE — Patient Instructions (Signed)
 Labs today Get cologuard and return for colon cancer screening

## 2023-11-25 NOTE — Progress Notes (Signed)
 Established Patient Office Visit  Subjective   Patient ID: Renee Wade, female    DOB: [birth year]  Age: 63 y.o. MRN: [id number]  Chief Complaint  Patient presents with   Diabetes    HPI Renee Wade is here today for a diabetes follow up. She has a history of T2DM, HTN, CKD  and Dyslipidemia. She recently went on a trip to [place]  and was unable to bring her Trulicity  because she had no way to keep it cold. She also states I ate whatever I wanted on the trip. She does not exercise and attributes this to still being in tax season. She does not take her blood sugar. She has started taking the Trulicity  again now that she is back. She is also compliant with Synjardy . She endorses nausea with the Trulicity  but says it is tolerable with Zofran .  She is compliant on Benicar  for her BP. She is taking Repatha  for her cholesterol.  Denies chest pain or pressure, palpitations or headaches. She said she went to the eye doctor and has a spider in her eye from flying but denies vision changes.    Review of Systems  All other systems reviewed and are negative.     Objective:     BP 129/65   Pulse 65   Wt 254 lb (115.2 kg)   SpO2 95%   BMI 37.51 kg/m  BP Readings from Last 3 Encounters:  11/25/23 129/65  08/19/23 126/66  05/20/23 129/60   Wt Readings from Last 3 Encounters:  11/25/23 254 lb (115.2 kg)  08/19/23 247 lb (112 kg)  05/20/23 251 lb (113.9 kg)      Physical Exam Constitutional:      Appearance: Normal appearance.  HENT:     Head: Normocephalic.   Cardiovascular:     Rate and Rhythm: Normal rate and regular rhythm.  Pulmonary:     Effort: Pulmonary effort is normal.   Musculoskeletal:     Right lower leg: No edema.     Left lower leg: No edema.   Neurological:     General: No focal deficit present.     Mental Status: She is alert.   Psychiatric:        Mood and Affect: Mood normal.      Results for orders placed or performed in visit on 11/25/23  POCT  HgB A1C  Result Value Ref Range   Hemoglobin A1C 6.4 (A) 4.0 - 5.6 %   HbA1c POC (<> result, manual entry)     HbA1c, POC (prediabetic range)     HbA1c, POC (controlled diabetic range)    POCT UA - Microalbumin  Result Value Ref Range   Microalbumin Ur, POC 30 mg/L   Creatinine, POC 300 mg/dL   Albumin/Creatinine Ratio, Urine, POC <30        Assessment & Plan:  Aaron AasAaron AasJoycelyn Noa Feher was seen today for diabetes.  Diagnoses and all orders for this visit:  Type 2 diabetes mellitus with stage 3b chronic kidney disease, without long-term current use of insulin (HCC) -     POCT HgB A1C -     CMP14+EGFR -     Lipid panel -     POCT UA - Microalbumin  Colon cancer screening -     Cologuard  Dyslipidemia, goal LDL below 70 -     Lipid panel  Medication management -     CMP14+EGFR -     Lipid panel -     TSH + free  T4 -     CBC w/Diff/Platelet -     VITAMIN D  25 Hydroxy (Vit-D Deficiency, Fractures)    A1C Is due today: It is up today at 6.4% (last A1C 6.1%), advised pt to take Trulicity  consistently and work on diet as well as exercise Get urine microalbumin today Blood pressure was doing well today at 129/65; continue on the Benicar  Continue taking Repatha , last LDL was 48 will get lipid panel today, Goal <70 Pt denies colonoscopy but agrees to Cologaurd test  Pt denies COVID, pneumococcal and shingles vaccine  F/U in 3 months to check A1C and kidney function     Return in about 3 months (around 02/25/2024).    Azariah Latendresse, PA-C

## 2023-11-26 LAB — CMP14+EGFR
ALT: 18 IU/L (ref 0–32)
AST: 16 IU/L (ref 0–40)
Albumin: 3.9 g/dL (ref 3.9–4.9)
Alkaline Phosphatase: 68 IU/L (ref 44–121)
BUN/Creatinine Ratio: 9 — ABNORMAL LOW (ref 12–28)
BUN: 15 mg/dL (ref 8–27)
Bilirubin Total: 0.6 mg/dL (ref 0.0–1.2)
CO2: 23 mmol/L (ref 20–29)
Calcium: 10.1 mg/dL (ref 8.7–10.3)
Chloride: 104 mmol/L (ref 96–106)
Creatinine, Ser: 1.68 mg/dL — ABNORMAL HIGH (ref 0.57–1.00)
Globulin, Total: 2.4 g/dL (ref 1.5–4.5)
Glucose: 113 mg/dL — ABNORMAL HIGH (ref 70–99)
Potassium: 4.4 mmol/L (ref 3.5–5.2)
Sodium: 139 mmol/L (ref 134–144)
Total Protein: 6.3 g/dL (ref 6.0–8.5)
eGFR: 34 mL/min/{1.73_m2} — ABNORMAL LOW (ref >59)

## 2023-11-26 LAB — CBC WITH DIFFERENTIAL/PLATELET
Basophils Absolute: 0.1 10*3/uL (ref 0.0–0.2)
Basos: 1 %
EOS (ABSOLUTE): 0.1 10*3/uL (ref 0.0–0.4)
Eos: 2 %
Hematocrit: 33.3 % — ABNORMAL LOW (ref 34.0–46.6)
Hemoglobin: 10.3 g/dL — ABNORMAL LOW (ref 11.1–15.9)
Immature Grans (Abs): 0 10*3/uL (ref 0.0–0.1)
Immature Granulocytes: 0 %
Lymphocytes Absolute: 2.1 10*3/uL (ref 0.7–3.1)
Lymphs: 44 %
MCH: 27 pg (ref 26.6–33.0)
MCHC: 30.9 g/dL — ABNORMAL LOW (ref 31.5–35.7)
MCV: 87 fL (ref 79–97)
Monocytes Absolute: 0.3 10*3/uL (ref 0.1–0.9)
Monocytes: 7 %
Neutrophils Absolute: 2.1 10*3/uL (ref 1.4–7.0)
Neutrophils: 46 %
Platelets: 334 10*3/uL (ref 150–450)
RBC: 3.82 x10E6/uL (ref 3.77–5.28)
RDW: 14.7 % (ref 11.7–15.4)
WBC: 4.7 10*3/uL (ref 3.4–10.8)

## 2023-11-26 LAB — LIPID PANEL
Chol/HDL Ratio: 2.6 ratio (ref 0.0–4.4)
Cholesterol, Total: 158 mg/dL (ref 100–199)
HDL: 60 mg/dL (ref >39)
LDL Chol Calc (NIH): 80 mg/dL (ref 0–99)
Triglycerides: 98 mg/dL (ref 0–149)
VLDL Cholesterol Cal: 18 mg/dL (ref 5–40)

## 2023-11-26 LAB — VITAMIN D 25 HYDROXY (VIT D DEFICIENCY, FRACTURES): Vit D, 25-Hydroxy: 42 ng/mL (ref 30.0–100.0)

## 2023-11-26 LAB — TSH+FREE T4
Free T4: 1 ng/dL (ref 0.82–1.77)
TSH: 2.48 u[IU]/mL (ref 0.450–4.500)

## 2023-11-27 ENCOUNTER — Ambulatory Visit: Payer: Self-pay | Admitting: Physician Assistant

## 2023-11-27 NOTE — Progress Notes (Signed)
 Renee Wade,   Kidney function is stable.  Cholesterol looks really good.  Thyroid looks great.  Vitamin d  looks good.  Hemoglobin is low trending towards anemia.  Need to start iron supplement daily with breakfast over the counter and recheck in 3 months.

## 2023-12-24 ENCOUNTER — Other Ambulatory Visit (HOSPITAL_COMMUNITY): Payer: Self-pay

## 2023-12-24 ENCOUNTER — Telehealth: Payer: Self-pay

## 2023-12-24 NOTE — Telephone Encounter (Signed)
 Pharmacy Patient Advocate Encounter  Received notification from [employer] that Prior Authorization for Repatha  SureClick 140mg /ml has been APPROVED from 12/24/23 to 12/23/24   PA #/Case ID/Reference #: [id number]

## 2023-12-24 NOTE — Telephone Encounter (Signed)
 Pharmacy Patient Advocate Encounter   Received notification from CoverMyMeds that prior authorization for Repatha  Sureclick 140mg /ml is required/requested.   Insurance verification completed.   The patient is insured through [facility] .   Per test claim: PA required; PA submitted to above mentioned insurance via CoverMyMeds Key/confirmation #/EOC [id number] Status is pending

## 2024-01-02 ENCOUNTER — Other Ambulatory Visit: Payer: Self-pay | Admitting: Physician Assistant

## 2024-01-02 DIAGNOSIS — F411 Generalized anxiety disorder: Secondary | ICD-10-CM

## 2024-01-04 NOTE — Telephone Encounter (Signed)
 Refill request received. Routing to Dr. Alvan for review since Vermell is off. Please advise.

## 2024-02-04 ENCOUNTER — Other Ambulatory Visit: Payer: Self-pay | Admitting: Physician Assistant

## 2024-02-09 ENCOUNTER — Encounter: Payer: Self-pay | Admitting: Sports Medicine

## 2024-02-11 ENCOUNTER — Ambulatory Visit: Admitting: Urgent Care

## 2024-02-11 ENCOUNTER — Encounter: Payer: Self-pay | Admitting: Urgent Care

## 2024-02-11 VITALS — BP 106/65 | HR 68 | Resp 20 | Ht 69.0 in | Wt 253.0 lb

## 2024-02-11 DIAGNOSIS — J4521 Mild intermittent asthma with (acute) exacerbation: Secondary | ICD-10-CM

## 2024-02-11 DIAGNOSIS — R051 Acute cough: Secondary | ICD-10-CM

## 2024-02-11 MED ORDER — MONTELUKAST SODIUM 10 MG PO TABS
10.0000 mg | ORAL_TABLET | Freq: Every day | ORAL | 0 refills | Status: AC
Start: 2024-02-11 — End: ?

## 2024-02-11 MED ORDER — METHYLPREDNISOLONE SODIUM SUCC 125 MG IJ SOLR
125.0000 mg | Freq: Once | INTRAMUSCULAR | Status: AC
  Administered 2024-02-11: 125 mg via INTRAMUSCULAR

## 2024-02-11 MED ORDER — HYDROCOD POLI-CHLORPHE POLI ER 10-8 MG/5ML PO SUER: 5.0000 mL | Freq: Every evening | ORAL | 0 refills | Status: DC | PRN

## 2024-02-11 NOTE — Patient Instructions (Addendum)
 You were given a solu-medrol  injection here today.  Please continue your mucinex during the day with plenty of water. Please take singulair  at night.  Use the cough medication at night. It will make you drowsy.  Keep your follow up in 2 weeks.

## 2024-02-11 NOTE — Progress Notes (Signed)
 Established Patient Office Visit  Subjective:  Patient ID: Renee Wade, female    DOB: 09-19-1960  Age: 63 y.o. MRN: [id number]  Chief Complaint  Patient presents with   Cough    Dry cough  1 week onset    HPI  Discussed the use of AI scribe software for clinical note transcription with the patient, who Wade verbal consent to proceed.  History of Present Illness   Renee Wade is a 63 year old female who presents with a persistent cough.  She has been experiencing a persistent cough for over a week, described as a 'deep, deep cough' that sometimes causes back pain due to the intensity of the coughing fits. This cough is similar to one she experienced in June of the previous year, which she attributes to cleaning her parents' RV and the changing leaves, suggesting a possible allergic trigger.  No fever or nasal discharge, although she occasionally coughs up a small amount of sputum. The cough is primarily located in her chest and radiates to her back, sometimes causing pain in her lower back from the force of coughing.  She has tried over-the-counter Mucinex, which sometimes helps, but has not used any long-term breathing medications or inhalers, as she finds inhalers unsettling. She cannot tolerate pills well, as they affect her sleep, but she has previously tolerated Solu-Medrol  injections without issues.  She is currently on medication for blood pressure and blood sugar, but has not experienced any significant blood sugar issues following steroid use, except for a temporary spike on the day of the injection. She reports waking up at night due to coughing but manages it with cough drops and a 'ten or fifteen minute cough hack' before returning to sleep.       Patient Active Problem List   Diagnosis Date Noted   Stage 3b chronic kidney disease (HCC) 05/20/2023   Urinary frequency 09/30/2022   Lumbar spondylosis 05/20/2022   Statin myopathy 04/23/2022   Nausea  01/15/2022   Microalbuminuria 01/15/2022   Prolonged severe nausea and vomiting 10/16/2021   Right flank pain 10/16/2021   Infected olecranon bursa, left 10/02/2021   Epigastric pain 10/17/2020   Bacterial vaginosis 10/16/2020   Menopausal symptoms 02/28/2019   Acute idiopathic gout of left foot 05/28/2018   Statin declined 04/05/2018   Lower extremity edema 03/17/2018   Dyslipidemia, goal LDL below 70 11/14/2016   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) 11/13/2016   It band syndrome, left 11/12/2016   Trochanteric bursitis of left hip 11/12/2016   Adenomatous polyp 02/13/2016   Ganglion cyst of wrist, left 01/08/2016   Class 2 severe obesity due to excess calories with serious comorbidity and body mass index (BMI) of 37.0 to 37.9 in adult (HCC) 12/19/2015   Esophageal stenosis 12/18/2015   Gastroesophageal reflux disease with esophagitis 12/18/2015   Essential hypertension, benign 12/18/2015   History of DVT (deep vein thrombosis) 12/18/2015   Anxiety state 12/18/2015   CKD (chronic kidney disease) stage 3, GFR 30-59 ml/min (HCC) 12/18/2015   Past Medical History:  Diagnosis Date   Depression    H/O DVT    S/p surgery/hysterectomy in 2008   Hypertension    Obesity    Past Surgical History:  Procedure Laterality Date   CESAREAN SECTION  06/10/1987   TONSILLECTOMY  06/09/1970   TOTAL ABDOMINAL HYSTERECTOMY  06/10/2007   WISDOM TOOTH EXTRACTION  06/10/1987   Social History   Tobacco Use  Smoking status: Never   Smokeless tobacco: Never  Vaping Use   Vaping status: Never Used  Substance Use Topics   Alcohol use: No    Alcohol/week: 0.0 standard drinks of alcohol   Drug use: No      ROS: as noted in HPI  Objective:     BP 106/65 (BP Location: Left Arm, Patient Position: Sitting, Cuff Size: Large)   Pulse 68   Resp 20   Ht 5' 9 (1.753 m)   Wt 253 lb (114.8 kg)   SpO2 97%   BMI 37.36 kg/m  BP Readings from Last 3 Encounters:   02/11/24 106/65  11/25/23 129/65  08/19/23 126/66   Wt Readings from Last 3 Encounters:  02/11/24 253 lb (114.8 kg)  11/25/23 254 lb (115.2 kg)  08/19/23 247 lb (112 kg)      Physical Exam Vitals and nursing note reviewed.  Constitutional:      General: She is not in acute distress.    Appearance: Normal appearance. She is not ill-appearing, toxic-appearing or diaphoretic.  HENT:     Head: Normocephalic and atraumatic.     Right Ear: Ear canal and external ear normal. No drainage, swelling or tenderness. A middle ear effusion is present. There is no impacted cerumen. Tympanic membrane is not injected, scarred, perforated or erythematous.     Left Ear: Ear canal and external ear normal. No drainage, swelling or tenderness. A middle ear effusion is present. There is no impacted cerumen. Tympanic membrane is not injected, scarred, perforated or erythematous.     Nose: Congestion and rhinorrhea present. Rhinorrhea is clear.     Right Turbinates: Not enlarged or swollen.     Left Turbinates: Not enlarged or swollen.     Right Sinus: No maxillary sinus tenderness or frontal sinus tenderness.     Left Sinus: No maxillary sinus tenderness or frontal sinus tenderness.     Mouth/Throat:     Lips: Pink.     Mouth: Mucous membranes are moist.     Pharynx: Oropharynx is clear. Uvula midline. No pharyngeal swelling, oropharyngeal exudate, posterior oropharyngeal erythema, uvula swelling or postnasal drip.  Cardiovascular:     Rate and Rhythm: Normal rate and regular rhythm.  Pulmonary:     Effort: Pulmonary effort is normal. No respiratory distress.     Breath sounds: Normal breath sounds. No stridor. No wheezing, rhonchi or rales.  Musculoskeletal:     Cervical back: Normal range of motion and neck supple. No rigidity or tenderness.  Lymphadenopathy:     Cervical: No cervical adenopathy.  Skin:    General: Skin is warm and dry.     Coloration: Skin is not jaundiced.     Findings: No  bruising, erythema or rash.  Neurological:     General: No focal deficit present.     Mental Status: She is alert and oriented to person, place, and time.     Motor: No weakness.     Gait: Gait normal.      No results found for any visits on 02/11/24.    The 10-year ASCVD risk score (Arnett DK, et al., 2019) is: 5.8%  Assessment & Plan:  Mild intermittent reactive airway disease with acute exacerbation -     Montelukast  Sodium; Take 1 tablet (10 mg total) by mouth at bedtime.  Dispense: 14 tablet; Refill: 0 -     Hydrocod Poli-Chlorphe Poli ER; Take 5 mLs by mouth at bedtime as needed for cough (cough, will cause  drowsiness.).  Dispense: 70 mL; Refill: 0 -     methylPREDNISolone  Sodium Succ  Acute cough -     methylPREDNISolone  Sodium Succ   Assessment and Plan    Reactive airway disease with acute cough Persistent cough likely triggered by dust and allergens, causing back pain and sleep disruption. No wheezing or need for inhalers. Assessed as reactive airway disease similar to asthma. - Administered Solu-Medrol  injection. Temporary blood sugar spike possible. - Prescribed montelukast  at bedtime. - Prescribed nighttime cough suppressant. - Advised continuation of Mucinex during the day without Sudafed or cough suppressant.        No follow-ups on file.   Renee LITTIE Gave, PA

## 2024-02-19 ENCOUNTER — Other Ambulatory Visit: Payer: Self-pay | Admitting: Medical Genetics

## 2024-02-22 ENCOUNTER — Ambulatory Visit
Admission: EM | Admit: 2024-02-22 | Discharge: 2024-02-22 | Disposition: A | Discharge status: Home / Self Care | Attending: Family Medicine | Admitting: Family Medicine

## 2024-02-22 DIAGNOSIS — R059 Cough, unspecified: Secondary | ICD-10-CM | POA: Diagnosis not present

## 2024-02-22 DIAGNOSIS — J069 Acute upper respiratory infection, unspecified: Secondary | ICD-10-CM | POA: Diagnosis not present

## 2024-02-22 MED ORDER — AZITHROMYCIN 250 MG PO TABS: 250.0000 mg | ORAL_TABLET | Freq: Every day | ORAL | 0 refills | Status: DC

## 2024-02-22 MED ORDER — PROMETHAZINE-DM 6.25-15 MG/5ML PO SYRP
5.0000 mL | ORAL_SOLUTION | Freq: Two times a day (BID) | ORAL | 0 refills | Status: AC | PRN
Start: 2024-02-22 — End: ?

## 2024-02-22 MED ORDER — METHYLPREDNISOLONE SODIUM SUCC 125 MG IJ SOLR
125.0000 mg | Freq: Once | INTRAMUSCULAR | Status: AC
  Administered 2024-02-22: 125 mg via INTRAMUSCULAR

## 2024-02-22 NOTE — Discharge Instructions (Addendum)
 Advised patient to take medication as directed with food to completion.  Advised may take Promethazine  DM at night prior to sleep for cough due to sedative effects.  Encouraged increase daily water intake to 6 to 4 ounces per day while taking these medications.  Advised if symptoms worsen and/or unresolved please follow-up with your PCP or here for further evaluation.

## 2024-02-22 NOTE — ED Provider Notes (Signed)
 [facility]    CSN: [id number] Arrival date & time: 02/22/24  1002      History   Chief Complaint Chief Complaint  Patient presents with   Cough   Back Pain    RT sided    HPI Renee Wade is a 63 y.o. female.   HPI Pleasant 63 year old female presents with cough for 2 weeks with right sided back pain.  PMH significant for HTN, DVT, and obesity.  Past Medical History:  Diagnosis Date   Depression    H/O DVT    S/p surgery/hysterectomy in 2008   Hypertension    Obesity     Patient Active Problem List   Diagnosis Date Noted   Stage 3b chronic kidney disease (HCC) 05/20/2023   Urinary frequency 09/30/2022   Lumbar spondylosis 05/20/2022   Statin myopathy 04/23/2022   Nausea 01/15/2022   Microalbuminuria 01/15/2022   Prolonged severe nausea and vomiting 10/16/2021   Right flank pain 10/16/2021   Infected olecranon bursa, left 10/02/2021   Epigastric pain 10/17/2020   Bacterial vaginosis 10/16/2020   Menopausal symptoms 02/28/2019   Acute idiopathic gout of left foot 05/28/2018   Statin declined 04/05/2018   Lower extremity edema 03/17/2018   Dyslipidemia, goal LDL below 70 11/14/2016   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) 11/13/2016   It band syndrome, left 11/12/2016   Trochanteric bursitis of left hip 11/12/2016   Adenomatous polyp 02/13/2016   Ganglion cyst of wrist, left 01/08/2016   Class 2 severe obesity due to excess calories with serious comorbidity and body mass index (BMI) of 37.0 to 37.9 in adult (HCC) 12/19/2015   Esophageal stenosis 12/18/2015   Gastroesophageal reflux disease with esophagitis 12/18/2015   Essential hypertension, benign 12/18/2015   History of DVT (deep vein thrombosis) 12/18/2015   Anxiety state 12/18/2015   CKD (chronic kidney disease) stage 3, GFR 30-59 ml/min (HCC) 12/18/2015    Past Surgical History:  Procedure Laterality Date   CESAREAN SECTION  06/10/1987   TONSILLECTOMY   06/09/1970   TOTAL ABDOMINAL HYSTERECTOMY  06/10/2007   WISDOM TOOTH EXTRACTION  06/10/1987    OB History   No obstetric history on file.      Home Medications    Prior to Admission medications   Medication Sig Start Date End Date Taking? Authorizing Provider  azithromycin  (ZITHROMAX ) 250 MG tablet Take 1 tablet (250 mg total) by mouth daily. Take first 2 tablets together, then 1 every day until finished. 02/22/24  Yes Teddy Sharper, FNP  promethazine -dextromethorphan (PROMETHAZINE -DM) 6.25-15 MG/5ML syrup Take 5 mLs by mouth 2 (two) times daily as needed for cough. 02/22/24  Yes Teddy Sharper, FNP  aspirin 325 MG tablet Take by mouth.    [provider]  chlorpheniramine-HYDROcodone  (TUSSIONEX) 10-8 MG/5ML Take 5 mLs by mouth at bedtime as needed for cough (cough, will cause drowsiness.). 02/11/24   Crain, Whitney L, PA  clonazePAM  (KLONOPIN ) 0.5 MG tablet TAKE ONE TABLET (0.5 MG total) BY MOUTH TWICE DAILY AS NEEDED FOR ANXIETY 01/04/24   Alvan Dorothyann BIRCH, MD  colchicine  0.6 MG tablet Take 2 tablets as needed for gout attack and repeat 1 tablet in one hour if needed for up to 3 days. 05/20/19   Breeback, [name] L, PA-C  diclofenac  sodium (VOLTAREN ) 1 % GEL Apply 2 g topically as needed. 09/30/17   Breeback, [name] L, PA-C  Dulaglutide  (TRULICITY ) 1.5 MG/0.5ML SOAJ Inject 1.5 mg into the skin once a week. 11/25/23  Breeback, [name] L, PA-C  Empagliflozin-metFORMIN  HCl ER (SYNJARDY  XR) 03-999 MG TB24 Take 1 tablet by mouth daily. 11/25/23   Breeback, [name] L, PA-C  hydrocortisone  2.5 % ointment Apply to affected area BID. 04/30/22   Breeback, [name] L, PA-C  Misc Natural Products (TART CHERRY ADVANCED PO) Take by mouth daily.    [provider]  montelukast  (SINGULAIR ) 10 MG tablet Take 1 tablet (10 mg total) by mouth at bedtime. 02/11/24   Crain, Whitney L, PA  olmesartan -hydrochlorothiazide  (BENICAR  HCT) 20-12.5 MG tablet Take 1 tablet by mouth daily. 08/19/23   Breeback, [name] L,  PA-C  Omega 3-6-9 Fatty Acids (OMEGA 3-6-9 COMPLEX PO) Take by mouth daily.    [provider]  ondansetron  (ZOFRAN ) 4 MG tablet Take 1 tablet (4 mg total) by mouth every 8 (eight) hours as needed for nausea or vomiting. 05/20/23   Breeback, [name] L, PA-C  ondansetron  (ZOFRAN -ODT) 8 MG disintegrating tablet Take 1 tablet (8 mg total) by mouth every 8 (eight) hours as needed for nausea. 02/04/24   Breeback, [name] L, PA-C  pantoprazole  (PROTONIX ) 40 MG tablet Take 1 tablet (40 mg total) by mouth 2 (two) times daily. 05/20/23   Breeback, Vermell CROME, PA-C  REPATHA  SURECLICK 140 MG/ML SOAJ INJECT 140MG  INTO THE SKIN EVERY 14 DAYS 11/24/23   Breeback, [name] L, PA-C    Family History Family History  Problem Relation Age of Onset   Heart attack Father    Ovarian cancer Other        grandmother   Diabetes Other        grandmother     Social History Social History   Tobacco Use   Smoking status: Never   Smokeless tobacco: Never  Vaping Use   Vaping status: Never Used  Substance Use Topics   Alcohol use: No    Alcohol/week: 0.0 standard drinks of alcohol   Drug use: No     Allergies   Morphine, Oxycodone, Belviq [lorcaserin  hcl], Codeine, Contrave [naltrexone-bupropion hcl er], Meloxicam , Metformin  and related, Ozempic  (0.25 or 0.5 mg-dose) [semaglutide (0.25 or 0.5mg -dos)], and Prednisone    Review of Systems Review of Systems  Respiratory:  Positive for cough.   Musculoskeletal:  Positive for arthralgias and myalgias.     Physical Exam Triage Vital Signs ED Triage Vitals [02/22/24 1017]  Encounter Vitals Group     BP 134/79     Girls Systolic BP Percentile      Girls Diastolic BP Percentile      Boys Systolic BP Percentile      Boys Diastolic BP Percentile      Pulse Rate 73     Resp 18     Temp (!) 97.5 F (36.4 C)     Temp Source Oral     SpO2 98 %     Weight      Height      Head Circumference      Peak Flow      Pain Score      Pain Loc      Pain Education       Exclude from Growth Chart    No data found.  Updated Vital Signs BP 134/79 (BP Location: Right Arm)   Pulse 73   Temp (!) 97.5 F (36.4 C) (Oral)   Resp 18   SpO2 98%   Visual Acuity Right Eye Distance:   Left Eye Distance:   Bilateral Distance:    Right Eye Near:   Left Eye Near:  Bilateral Near:     Physical Exam Vitals and nursing note reviewed.  Constitutional:      General: She is not in acute distress.    Appearance: Normal appearance. She is obese. She is ill-appearing.  HENT:     Head: Normocephalic and atraumatic.     Right Ear: Tympanic membrane, ear canal and external ear normal.     Left Ear: Tympanic membrane, ear canal and external ear normal.     Mouth/Throat:     Mouth: Mucous membranes are moist.     Pharynx: Oropharynx is clear.  Eyes:     Extraocular Movements: Extraocular movements intact.     Conjunctiva/sclera: Conjunctivae normal.     Pupils: Pupils are equal, round, and reactive to light.  Cardiovascular:     Rate and Rhythm: Normal rate and regular rhythm.     Pulses: Normal pulses.     Heart sounds: Normal heart sounds. No murmur heard. Pulmonary:     Effort: Pulmonary effort is normal.     Breath sounds: Normal breath sounds. No wheezing, rhonchi or rales.     Comments: Frequent nonproductive cough on exam Musculoskeletal:        General: Normal range of motion.     Cervical back: Normal range of motion and neck supple.  Skin:    General: Skin is warm and dry.  Neurological:     General: No focal deficit present.     Mental Status: She is alert and oriented to person, place, and time. Mental status is at baseline.  Psychiatric:        Mood and Affect: Mood normal.        Behavior: Behavior normal.      UC Treatments / Results  Labs (all labs ordered are listed, but only abnormal results are displayed) Labs Reviewed - No data to display  EKG   Radiology No results found.  Procedures Procedures (including critical  care time)  Medications Ordered in UC Medications  methylPREDNISolone  sodium succinate (SOLU-MEDROL ) 125 mg/2 mL injection 125 mg (125 mg Intramuscular Given 02/22/24 1044)    Initial Impression / Assessment and Plan / UC Course  I have reviewed the triage vital signs and the nursing notes.  Pertinent labs & imaging results that were available during my care of the patient were reviewed by me and considered in my medical decision making (see chart for details).     MDM: 1.  Acute URI-Rx'd Zithromax  (500 mg day 1, then 250 mg day 2-5); 2.  Cough, unspecified type-IM Solu-Medrol  125 mg given once in clinic and prior to discharge, Rx'd Promethazine  DM 6.25-10 mg / 5 mL syrup: Take 5 mL twice daily as needed for cough. Advised patient to take medication as directed with food to completion.  Advised may take Promethazine  DM at night prior to sleep for cough due to sedative effects.  Encouraged increase daily water intake to 6 to 4 ounces per day while taking these medications.  Advised if symptoms worsen and/or unresolved please follow-up with your PCP or here for further evaluation. Final Clinical Impressions(s) / UC Diagnoses   Final diagnoses:  Cough, unspecified type  Acute URI     Discharge Instructions      Advised patient to take medication as directed with food to completion.  Advised may take Promethazine  DM at night prior to sleep for cough due to sedative effects.  Encouraged increase daily water intake to 6 to 4 ounces per day while taking these medications.  Advised if symptoms worsen and/or unresolved please follow-up with your PCP or here for further evaluation.     ED Prescriptions     Medication Sig Dispense Auth. Provider   azithromycin  (ZITHROMAX ) 250 MG tablet Take 1 tablet (250 mg total) by mouth daily. Take first 2 tablets together, then 1 every day until finished. 6 tablet Jaloni Davoli, FNP   promethazine -dextromethorphan (PROMETHAZINE -DM) 6.25-15 MG/5ML syrup  Take 5 mLs by mouth 2 (two) times daily as needed for cough. 118 mL Jaja Switalski, FNP      PDMP not reviewed this encounter.   Teddy Sharper, FNP 02/22/24 1057

## 2024-02-22 NOTE — ED Triage Notes (Signed)
 Pt c/o cough x 2 weeks. Now having constant stabbing pain in RT side of back. Hx of season allergies. Saw PCP shortly after sxs started, tx with steroid shot and Singulair  as well as cough syrup.

## 2024-02-23 ENCOUNTER — Telehealth: Payer: Self-pay

## 2024-02-23 NOTE — Telephone Encounter (Signed)
 Lmtrc percall back protocol

## 2024-02-24 ENCOUNTER — Ambulatory Visit: Admitting: Physician Assistant

## 2024-02-24 ENCOUNTER — Other Ambulatory Visit (HOSPITAL_COMMUNITY)
Admission: RE | Admit: 2024-02-24 | Discharge: 2024-02-24 | Disposition: A | Discharge status: Home / Self Care | Payer: Self-pay | Source: Ambulatory Visit | Attending: Medical Genetics | Admitting: Medical Genetics

## 2024-02-24 VITALS — BP 128/88 | HR 80

## 2024-02-24 DIAGNOSIS — N1832 Chronic kidney disease, stage 3b: Secondary | ICD-10-CM | POA: Diagnosis not present

## 2024-02-24 DIAGNOSIS — I10 Essential (primary) hypertension: Secondary | ICD-10-CM | POA: Diagnosis not present

## 2024-02-24 DIAGNOSIS — R058 Other specified cough: Secondary | ICD-10-CM | POA: Diagnosis not present

## 2024-02-24 DIAGNOSIS — M545 Low back pain, unspecified: Secondary | ICD-10-CM | POA: Diagnosis not present

## 2024-02-24 DIAGNOSIS — G720 Drug-induced myopathy: Secondary | ICD-10-CM

## 2024-02-24 DIAGNOSIS — T466X5A Adverse effect of antihyperlipidemic and antiarteriosclerotic drugs, initial encounter: Secondary | ICD-10-CM

## 2024-02-24 DIAGNOSIS — Z7984 Long term (current) use of oral hypoglycemic drugs: Secondary | ICD-10-CM

## 2024-02-24 DIAGNOSIS — E1122 Type 2 diabetes mellitus with diabetic chronic kidney disease: Secondary | ICD-10-CM

## 2024-02-24 DIAGNOSIS — Z7985 Long-term (current) use of injectable non-insulin antidiabetic drugs: Secondary | ICD-10-CM

## 2024-02-24 DIAGNOSIS — E785 Hyperlipidemia, unspecified: Secondary | ICD-10-CM

## 2024-02-24 LAB — POCT GLYCOSYLATED HEMOGLOBIN (HGB A1C): Hemoglobin A1C: 6.5 % — AB (ref 4.0–5.6)

## 2024-02-24 MED ORDER — BENZONATATE 200 MG PO CAPS: 200.0000 mg | ORAL_CAPSULE | Freq: Three times a day (TID) | ORAL | 0 refills | Status: DC | PRN

## 2024-02-24 NOTE — Progress Notes (Unsigned)
 Established Patient Office Visit  Subjective   Patient ID: Renee Wade, female    DOB: 11/29/1960  Age: 63 y.o. MRN: [id number]  HPI Discussed the use of AI scribe software for clinical note transcription with the patient, who gave verbal consent to proceed.  History of Present Illness Renee Wade is a 63 year old female with diabetes and hypertension who presents with back pain and persistent cough.  Cough - Persistent cough since approximately August 27th - Initial treatment included a shot and two cough medicines, without antibiotics - Cough persisted until past Sunday, then worsened, causing severe back pain after a coughing episode - On Monday, received a shot, antibiotics, and a different cough medicine from another provider - Cough has improved since then; able to sleep at night - Continues to use cough drops, alternating types for variety - Unable to tolerate oral prednisone  due to adverse effects; receives injectable corticosteroids instead - Benzonatate  pearls have been effective in the past and requests them again  Back pain - Severe, localized back pain developed after forceful coughing on Sunday - Pain described as feeling like 'somebody stabbed me in the back' - No radiation of pain - Some improvement in pain since Monday after receiving treatment, but pain remains significant - Uses Tylenol  for pain relief - Applied a heating pad once on Sunday night - No use of muscle relaxers  Diabetes mellitus - Treated with Synjardy  and Trulicity   Hypertension - Treated with Benicar  (olmesartan ) and hydrochlorothiazide   Hyperlipidemia - Treated with Repatha , administered via auto-injector despite discomfort  Other medications - Uses Klonopin  and Zofran  as needed    ROS See HPI.    Objective:     BP 128/88 (Cuff Size: Normal)   Pulse 80   SpO2 99%  BP Readings from Last 3 Encounters:  02/26/24 128/88  02/22/24 134/79  02/11/24 106/65    Wt Readings from Last 3 Encounters:  02/11/24 253 lb (114.8 kg)  11/25/23 254 lb (115.2 kg)  08/19/23 247 lb (112 kg)    .SABRA Results for orders placed or performed in visit on 02/24/24  POCT HgB A1C   Collection Time: 02/24/24  8:20 AM  Result Value Ref Range   Hemoglobin A1C 6.5 (A) 4.0 - 5.6 %   HbA1c POC (<> result, manual entry)     HbA1c, POC (prediabetic range)     HbA1c, POC (controlled diabetic range)       Physical Exam Constitutional:      Appearance: Normal appearance. She is obese.  HENT:     Head: Normocephalic.  Cardiovascular:     Rate and Rhythm: Normal rate and regular rhythm.  Pulmonary:     Effort: Pulmonary effort is normal.     Breath sounds: Normal breath sounds. No wheezing or rhonchi.  Neurological:     General: No focal deficit present.     Mental Status: She is alert and oriented to person, place, and time.  Psychiatric:        Mood and Affect: Mood normal.        The 10-year ASCVD risk score (Arnett DK, et al., 2019) is: 8.4%    Assessment & Plan:  SABRASABRADiagnoses and all orders for this visit:  Type 2 diabetes mellitus with stage 3b chronic kidney disease, without long-term current use of insulin (HCC) -     POCT HgB A1C  Post-viral cough syndrome -     benzonatate  (TESSALON ) 200 MG capsule; Take 1 capsule (  200 mg total) by mouth 3 (three) times daily as needed.  Acute midline low back pain without sciatica  Dyslipidemia, goal LDL below 70  Statin myopathy  Essential hypertension, benign   Assessment and Plan Assessment & Plan Acute back pain Acute back pain post-coughing episode, managed with Tylenol  and heating pad. - Consider muscle relaxer for pain relief. - Advise heating pad use as needed.  Acute cough, likely post-viral Persistent cough post-viral illness, improved but present. Oral prednisone  not tolerated, using injectable form. - Prescribe benzodiazepine pearls for daytime cough. - Continue cough drops as  needed. - Consider another injectable steroid if cough persists.  Type 2 diabetes mellitus with diabetic chronic kidney disease Type 2 diabetes managed with Synjardy  and Trulicity . A1c at 6.5%. Pt declined flu, covid and pneumonia vaccine today.   Hypertension Hypertension managed with olmesartan  and hydrochlorothiazide . Blood pressure well-controlled.  Hyperlipidemia Hyperlipidemia managed with Repatha . Reports pain with auto-injector but able to use it.     Return in about 3 months (around 05/25/2024).    Davone Shinault, PA-C

## 2024-02-26 ENCOUNTER — Encounter: Payer: Self-pay | Admitting: Physician Assistant

## 2024-02-26 DIAGNOSIS — M545 Low back pain, unspecified: Secondary | ICD-10-CM | POA: Insufficient documentation

## 2024-03-04 LAB — GENECONNECT MOLECULAR SCREEN: Genetic Analysis Overall Interpretation: NEGATIVE

## 2024-03-10 ENCOUNTER — Other Ambulatory Visit: Payer: Self-pay | Admitting: Physician Assistant

## 2024-03-10 DIAGNOSIS — E1122 Type 2 diabetes mellitus with diabetic chronic kidney disease: Secondary | ICD-10-CM

## 2024-03-14 ENCOUNTER — Encounter: Payer: Self-pay | Admitting: Physician Assistant

## 2024-03-14 MED ORDER — FLUCONAZOLE 150 MG PO TABS: 150.0000 mg | ORAL_TABLET | Freq: Once | ORAL | 0 refills | Status: AC

## 2024-03-15 DIAGNOSIS — H25813 Combined forms of age-related cataract, bilateral: Secondary | ICD-10-CM | POA: Diagnosis not present

## 2024-03-15 DIAGNOSIS — H524 Presbyopia: Secondary | ICD-10-CM | POA: Diagnosis not present

## 2024-03-15 DIAGNOSIS — E119 Type 2 diabetes mellitus without complications: Secondary | ICD-10-CM | POA: Diagnosis not present

## 2024-03-15 DIAGNOSIS — G43B0 Ophthalmoplegic migraine, not intractable: Secondary | ICD-10-CM | POA: Diagnosis not present

## 2024-03-21 ENCOUNTER — Other Ambulatory Visit: Payer: Self-pay | Admitting: Physician Assistant

## 2024-03-21 DIAGNOSIS — E1122 Type 2 diabetes mellitus with diabetic chronic kidney disease: Secondary | ICD-10-CM

## 2024-03-21 DIAGNOSIS — I10 Essential (primary) hypertension: Secondary | ICD-10-CM

## 2024-03-23 ENCOUNTER — Other Ambulatory Visit: Payer: Self-pay | Admitting: Physician Assistant

## 2024-03-23 DIAGNOSIS — E1122 Type 2 diabetes mellitus with diabetic chronic kidney disease: Secondary | ICD-10-CM

## 2024-04-11 ENCOUNTER — Other Ambulatory Visit: Payer: Self-pay | Admitting: Family Medicine

## 2024-04-11 DIAGNOSIS — F411 Generalized anxiety disorder: Secondary | ICD-10-CM

## 2024-04-22 ENCOUNTER — Telehealth: Payer: Self-pay

## 2024-04-22 NOTE — Telephone Encounter (Signed)
 The next injection should be the 1.5mg . make sure she has refills.

## 2024-04-22 NOTE — Telephone Encounter (Signed)
 Copied from CRM #[id number]. Topic: Clinical - Medication Question >> Apr 22, 2024  4:05 PM Fonda T wrote: Reason for CRM: Received call from [facility] with [facility] pharmacy, calling on behalf of pt regarding medication, Trulicity .  States pt gave herself .75mg  dose of medication, but she normally takes 1.5 mg, and needs to know how pt should continue taking injections.   Can call pharmacy back at [phone] to advise.   [facility] - [place], [place] - [address] [place] [address] [place] [address] Phone: [phone] Fax: [phone]

## 2024-04-25 NOTE — Telephone Encounter (Signed)
 [place] - she was  informed that her next injection should be the 1.5 mg.

## 2024-04-25 NOTE — Telephone Encounter (Signed)
 Attempted call to patient and left a voice mail message requesting a return call.

## 2024-05-18 ENCOUNTER — Ambulatory Visit: Admitting: Physician Assistant

## 2024-05-25 ENCOUNTER — Ambulatory Visit: Admitting: Physician Assistant

## 2024-05-25 VITALS — BP 128/64 | HR 72 | Ht 69.0 in | Wt 243.0 lb

## 2024-05-25 DIAGNOSIS — N1832 Chronic kidney disease, stage 3b: Secondary | ICD-10-CM

## 2024-05-25 DIAGNOSIS — E1122 Type 2 diabetes mellitus with diabetic chronic kidney disease: Secondary | ICD-10-CM | POA: Diagnosis not present

## 2024-05-25 DIAGNOSIS — Z8249 Family history of ischemic heart disease and other diseases of the circulatory system: Secondary | ICD-10-CM | POA: Diagnosis not present

## 2024-05-25 DIAGNOSIS — E785 Hyperlipidemia, unspecified: Secondary | ICD-10-CM | POA: Diagnosis not present

## 2024-05-25 DIAGNOSIS — F411 Generalized anxiety disorder: Secondary | ICD-10-CM | POA: Diagnosis not present

## 2024-05-25 DIAGNOSIS — D649 Anemia, unspecified: Secondary | ICD-10-CM

## 2024-05-25 DIAGNOSIS — K2100 Gastro-esophageal reflux disease with esophagitis, without bleeding: Secondary | ICD-10-CM | POA: Diagnosis not present

## 2024-05-25 DIAGNOSIS — I10 Essential (primary) hypertension: Secondary | ICD-10-CM | POA: Diagnosis not present

## 2024-05-25 DIAGNOSIS — R0789 Other chest pain: Secondary | ICD-10-CM | POA: Insufficient documentation

## 2024-05-25 DIAGNOSIS — Z7985 Long-term (current) use of injectable non-insulin antidiabetic drugs: Secondary | ICD-10-CM

## 2024-05-25 DIAGNOSIS — Z7984 Long term (current) use of oral hypoglycemic drugs: Secondary | ICD-10-CM

## 2024-05-25 LAB — POCT GLYCOSYLATED HEMOGLOBIN (HGB A1C): Hemoglobin A1C: 6.1 % — AB (ref 4.0–5.6)

## 2024-05-25 MED ORDER — PANTOPRAZOLE SODIUM 40 MG PO TBEC: 40.0000 mg | DELAYED_RELEASE_TABLET | Freq: Two times a day (BID) | ORAL | 3 refills | Status: AC

## 2024-05-25 MED ORDER — TRULICITY 1.5 MG/0.5ML ~~LOC~~ SOAJ: 1.5000 mg | SUBCUTANEOUS | 0 refills | Status: AC

## 2024-05-25 MED ORDER — SYNJARDY XR 10-1000 MG PO TB24: 1.0000 | ORAL_TABLET | Freq: Every day | ORAL | 0 refills | Status: AC

## 2024-05-25 NOTE — Progress Notes (Unsigned)
° °  Established Patient Office Visit  Subjective   Patient ID: Renee Wade, female    DOB: Dec 23, 1960  Age: 63 y.o. MRN: [id number]  Chief Complaint  Patient presents with   Medical Management of Chronic Issues    Ast A1c was 6.5 Needs refills    HPI  {History (Optional):23778}  ROS    Objective:     BP 128/64   Pulse 72   Ht 5' 9 (1.753 m)   Wt 243 lb (110.2 kg)   SpO2 99%   BMI 35.88 kg/m  {Vitals History (Optional):23777}  Physical Exam   Results for orders placed or performed in visit on 05/25/24  POCT HgB A1C  Result Value Ref Range   Hemoglobin A1C 6.1 (A) 4.0 - 5.6 %   HbA1c POC (<> result, manual entry)     HbA1c, POC (prediabetic range)     HbA1c, POC (controlled diabetic range)      The 10-year ASCVD risk score (Arnett DK, et al., 2019) is: 8.4%    Assessment & Plan:      No follow-ups on file.    Boyd Buffalo, PA-C

## 2024-05-25 NOTE — Patient Instructions (Signed)
Stay on same medications

## 2024-05-26 ENCOUNTER — Ambulatory Visit: Payer: Self-pay | Admitting: Physician Assistant

## 2024-05-26 DIAGNOSIS — D649 Anemia, unspecified: Secondary | ICD-10-CM

## 2024-05-26 DIAGNOSIS — R790 Abnormal level of blood mineral: Secondary | ICD-10-CM

## 2024-05-26 LAB — CMP14+EGFR
ALT: 18 IU/L (ref 0–32)
AST: 17 IU/L (ref 0–40)
Albumin: 4.3 g/dL (ref 3.9–4.9)
Alkaline Phosphatase: 75 IU/L (ref 49–135)
BUN/Creatinine Ratio: 9 — ABNORMAL LOW (ref 12–28)
BUN: 14 mg/dL (ref 8–27)
Bilirubin Total: 0.7 mg/dL (ref 0.0–1.2)
CO2: 23 mmol/L (ref 20–29)
Calcium: 10.4 mg/dL — ABNORMAL HIGH (ref 8.7–10.3)
Chloride: 104 mmol/L (ref 96–106)
Creatinine, Ser: 1.49 mg/dL — ABNORMAL HIGH (ref 0.57–1.00)
Globulin, Total: 2.3 g/dL (ref 1.5–4.5)
Glucose: 84 mg/dL (ref 70–99)
Potassium: 4.3 mmol/L (ref 3.5–5.2)
Sodium: 143 mmol/L (ref 134–144)
Total Protein: 6.6 g/dL (ref 6.0–8.5)
eGFR: 39 mL/min/1.73 — ABNORMAL LOW (ref >59)

## 2024-05-26 LAB — CBC WITH DIFFERENTIAL/PLATELET
Basophils Absolute: 0.1 x10E3/uL (ref 0.0–0.2)
Basos: 1 %
EOS (ABSOLUTE): 0.1 x10E3/uL (ref 0.0–0.4)
Eos: 1 %
Hematocrit: 35.5 % (ref 34.0–46.6)
Hemoglobin: 10.7 g/dL — ABNORMAL LOW (ref 11.1–15.9)
Immature Grans (Abs): 0 x10E3/uL (ref 0.0–0.1)
Immature Granulocytes: 0 %
Lymphocytes Absolute: 2.6 x10E3/uL (ref 0.7–3.1)
Lymphs: 35 %
MCH: 25.5 pg — ABNORMAL LOW (ref 26.6–33.0)
MCHC: 30.1 g/dL — ABNORMAL LOW (ref 31.5–35.7)
MCV: 85 fL (ref 79–97)
Monocytes Absolute: 0.5 x10E3/uL (ref 0.1–0.9)
Monocytes: 7 %
Neutrophils Absolute: 4.2 x10E3/uL (ref 1.4–7.0)
Neutrophils: 56 %
Platelets: 403 x10E3/uL (ref 150–450)
RBC: 4.2 x10E6/uL (ref 3.77–5.28)
RDW: 15.2 % (ref 11.7–15.4)
WBC: 7.4 x10E3/uL (ref 3.4–10.8)

## 2024-05-26 LAB — IRON,TIBC AND FERRITIN PANEL
Ferritin: 11 ng/mL — ABNORMAL LOW (ref 15–150)
Iron Saturation: 7 % — CL (ref 15–55)
Iron: 31 ug/dL (ref 27–139)
Total Iron Binding Capacity: 417 ug/dL (ref 250–450)
UIBC: 386 ug/dL — ABNORMAL HIGH (ref 118–369)

## 2024-05-26 NOTE — Progress Notes (Signed)
 [name],   Kidney's staying stable at baseline.  Avoid any extra calcium  supplementation.  Make sure taking vitamin D  supplementation daily.   Your are anemic. How much iron are you taking? If can not tolerate we could consider referral for iron infusion.

## 2024-05-27 DIAGNOSIS — R790 Abnormal level of blood mineral: Secondary | ICD-10-CM | POA: Insufficient documentation

## 2024-05-27 DIAGNOSIS — D649 Anemia, unspecified: Secondary | ICD-10-CM | POA: Insufficient documentation

## 2024-06-20 ENCOUNTER — Encounter: Payer: Self-pay | Admitting: Family

## 2024-06-20 ENCOUNTER — Inpatient Hospital Stay

## 2024-06-20 ENCOUNTER — Inpatient Hospital Stay: Attending: Family | Admitting: Family

## 2024-06-20 VITALS — BP 124/48 | HR 76 | Temp 98.2°F | Resp 18 | Ht 66.5 in | Wt 246.1 lb

## 2024-06-20 VITALS — BP 114/56 | HR 66

## 2024-06-20 DIAGNOSIS — Z8041 Family history of malignant neoplasm of ovary: Secondary | ICD-10-CM

## 2024-06-20 DIAGNOSIS — E119 Type 2 diabetes mellitus without complications: Secondary | ICD-10-CM | POA: Diagnosis not present

## 2024-06-20 DIAGNOSIS — Z90710 Acquired absence of both cervix and uterus: Secondary | ICD-10-CM | POA: Insufficient documentation

## 2024-06-20 DIAGNOSIS — Z86718 Personal history of other venous thrombosis and embolism: Secondary | ICD-10-CM | POA: Diagnosis not present

## 2024-06-20 DIAGNOSIS — I10 Essential (primary) hypertension: Secondary | ICD-10-CM | POA: Diagnosis not present

## 2024-06-20 DIAGNOSIS — D509 Iron deficiency anemia, unspecified: Secondary | ICD-10-CM

## 2024-06-20 DIAGNOSIS — Z7985 Long-term (current) use of injectable non-insulin antidiabetic drugs: Secondary | ICD-10-CM

## 2024-06-20 MED ORDER — SODIUM CHLORIDE 0.9 % IV SOLN: INTRAVENOUS | Status: DC

## 2024-06-20 MED ORDER — SODIUM CHLORIDE 0.9 % IV SOLN
510.0000 mg | Freq: Once | INTRAVENOUS | Status: AC
  Administered 2024-06-20: 510 mg via INTRAVENOUS
  Filled 2024-06-20: qty 510

## 2024-06-20 NOTE — Patient Instructions (Addendum)
 " [facility] - A DEPT OF [facility]  Discharge Instructions: Thank you for choosing [facility] to provide your oncology and hematology care.   If you have a lab appointment with the Cancer Center, please go directly to the Cancer Center and check in at the registration area.  Wear comfortable clothing and clothing appropriate for easy access to any Portacath or PICC line.   We strive to give you quality time with your provider. You may need to reschedule your appointment if you arrive late (15 or more minutes).  Arriving late affects you and other patients whose appointments are after yours.  Also, if you miss three or more appointments without notifying the office, you may be dismissed from the clinic at the providers discretion.      For prescription refill requests, have your pharmacy contact our office and allow 72 hours for refills to be completed.    Today you received the following agents Ferumoxytol       To help prevent nausea and vomiting after your treatment, we encourage you to take your nausea medication as directed.  BELOW ARE SYMPTOMS THAT SHOULD BE REPORTED IMMEDIATELY: *FEVER GREATER THAN 100.4 F (38 C) OR HIGHER *CHILLS OR SWEATING *NAUSEA AND VOMITING THAT IS NOT CONTROLLED WITH YOUR NAUSEA MEDICATION *UNUSUAL SHORTNESS OF BREATH *UNUSUAL BRUISING OR BLEEDING *URINARY PROBLEMS (pain or burning when urinating, or frequent urination) *BOWEL PROBLEMS (unusual diarrhea, constipation, pain near the anus) TENDERNESS IN MOUTH AND THROAT WITH OR WITHOUT PRESENCE OF ULCERS (sore throat, sores in mouth, or a toothache) UNUSUAL RASH, SWELLING OR PAIN  UNUSUAL VAGINAL DISCHARGE OR ITCHING   Items with * indicate a potential emergency and should be followed up as soon as possible or go to the Emergency Department if any problems should occur.  Please show the CHEMOTHERAPY ALERT CARD or IMMUNOTHERAPY ALERT CARD at check-in to the  Emergency Department and triage nurse. Should you have questions after your visit or need to cancel or reschedule your appointment, please contact [facility] - A DEPT OF [facility]  [phone] and follow the prompts.  Office hours are 8:00 a.m. to 4:30 p.m. Monday - Friday. Please note that voicemails left after 4:00 p.m. may not be returned until the following business day.  We are closed weekends and major holidays. You have access to a nurse at all times for urgent questions. Please call the main number to the clinic [phone] and follow the prompts.  For any non-urgent questions, you may also contact your provider using MyChart. We now offer e-Visits for anyone 37 and older to request care online for non-urgent symptoms. For details visit [email or web address].   Also download the MyChart app! Go to the app store, search MyChart, open the app, select [facility], and log in with your MyChart username and password.  Ferumoxytol  Injection What is this medication? FERUMOXYTOL  (FER ue MOX i tol) treats low levels of iron in your body (iron deficiency anemia). Iron is a mineral that plays an important role in making red blood cells, which carry oxygen from your lungs to the rest of your body. This medicine may be used for other purposes; ask your health care provider or pharmacist if you have questions. COMMON BRAND NAME(S): Feraheme What should I tell my care team before I take this medication? They need to know if you have any of these conditions: Anemia not caused by low iron levels  High levels of iron in the blood Magnetic resonance imaging (MRI) test scheduled An unusual or allergic reaction to iron, other medications, foods, dyes, or preservatives Pregnant or trying to get pregnant Breastfeeding How should I use this medication? This medication is injected into a vein. It is given by your care team in a hospital or clinic setting. Talk to your  care team the use of this medication in children. Special care may be needed. Overdosage: If you think you have taken too much of this medicine contact a poison control center or emergency room at once. NOTE: This medicine is only for you. Do not share this medicine with others. What if I miss a dose? It is important not to miss your dose. Call your care team if you are unable to keep an appointment. What may interact with this medication? Other iron products This list may not describe all possible interactions. Give your health care provider a list of all the medicines, herbs, non-prescription drugs, or dietary supplements you use. Also tell them if you smoke, drink alcohol, or use illegal drugs. Some items may interact with your medicine. What should I watch for while using this medication? Visit your care team for regular checks on your progress. Tell your care team if your symptoms do not start to get better or if they get worse. You may need blood work done while you are taking this medication. You may need to eat more foods that contain iron. Talk to your care team. Foods that contain iron include whole grains or cereals, dried fruits, beans, peas, leafy green vegetables, and organ meats (liver, kidney). What side effects may I notice from receiving this medication? Side effects that you should report to your care team as soon as possible: Allergic reactions--skin rash, itching, hives, swelling of the face, lips, tongue, or throat Low blood pressure--dizziness, feeling faint or lightheaded, blurry vision Shortness of breath Side effects that usually do not require medical attention (report to your care team if they continue or are bothersome): Flushing Headache Joint pain Muscle pain Nausea Pain, redness, or irritation at injection site This list may not describe all possible side effects. Call your doctor for medical advice about side effects. You may report side effects to FDA at  1-800-FDA-1088. Where should I keep my medication? This medication is given in a hospital or clinic. It will not be stored at home. NOTE: This sheet is a summary. It may not cover all possible information. If you have questions about this medicine, talk to your doctor, pharmacist, or health care provider.  2024 Elsevier/Gold Standard (2023-01-14 00:00:00) "

## 2024-06-20 NOTE — Progress Notes (Signed)
 Hematology/Oncology Consultation   Name: Azaiah Mello      MRN: [id number]    Location: Room/bed info not found  Date: 06/20/2024 Time:12:00 PM   REFERRING PHYSICIAN:  Vermell Bologna, PA-C  REASON FOR CONSULT:  IDA   DIAGNOSIS: Iron deficiency anemia   HISTORY OF PRESENT ILLNESS:  Ms. Mckeon is a pleasant 64 yo caucasian female with long history of IDA. She states this was present in 2009 and part of why she had her total hysterectomy.  She has not noted any obvious blood loss.  She has history of n/v wit GERD and takes protonix  BID as well as Zofran  PRN.  Saturation is only 7% and ferritin 11.  She has had occasional palpitations which she has attributed to stress and anxiety.  She has also had some changes with floaters in her vision and has gotten new glasses.  She cares for her parents. Her mother has dementia. 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  No known familial history of anemia.  She has type II diabetes. She states that she has lost a good amount of weight on Trulicity .  No history of thyroid disease.  She had her colonoscopy in 05/2019 showed severe diverticulosis throughout the colon as well as removal of some polyps (path report not available).  Mammogram over due, last done in 2024 result negative.  No personal history of cancer. Maternal grandmother had ovarian cancer.  No smoking, ETOH or recreational drug use.  No fever, chills, n/v, cough, rash, dizziness, SOB, chest pain, palpitations, abdominal pain or changes in bowel or bladder habits.  No swelling, tenderness, numbness or tingling in her extremities.  No falls or syncope reported.  Appetite and hydration are good. Weight is stable at 246 lbs.   ROS: All other 10 point review of systems is negative.   PAST MEDICAL HISTORY:   Past Medical History:  Diagnosis Date   Depression    H/O DVT    S/p surgery/hysterectomy in 2008   Hypertension    Obesity     ALLERGIES: Allergies[1]    MEDICATIONS:   Medications Ordered Prior to Encounter[2]   PAST SURGICAL HISTORY Past Surgical History:  Procedure Laterality Date   CESAREAN SECTION  06/10/1987   TONSILLECTOMY  06/09/1970   TOTAL ABDOMINAL HYSTERECTOMY  06/10/2007   WISDOM TOOTH EXTRACTION  06/10/1987    FAMILY HISTORY: Family History  Problem Relation Age of Onset   Heart attack Father    Ovarian cancer Other        grandmother   Diabetes Other        grandmother     SOCIAL HISTORY:  reports that she has never smoked. She has never used smokeless tobacco. She reports that she does not drink alcohol and does not use drugs.  PERFORMANCE STATUS: The patient's performance status is 1 - Symptomatic but completely ambulatory  PHYSICAL EXAM: Most Recent Vital Signs: Blood pressure (!) 124/48, pulse 76, temperature 98.2 F (36.8 C), temperature source Oral, resp. rate 18, height 5' 6.5 (1.689 m), weight 246 lb 1.3 oz (111.6 kg), SpO2 100%. BP (!) 124/48 (BP Location: Right Arm, Patient Position: Sitting, Cuff Size: Normal)   Pulse 76   Temp 98.2 F (36.8 C) (Oral)   Resp 18   Ht 5' 6.5 (1.689 m)   Wt 246 lb 1.3 oz (111.6 kg)   SpO2 100%   BMI 39.12 kg/m   General Appearance:    Alert, cooperative, no distress, appears stated age  Head:  Normocephalic, without obvious abnormality, atraumatic  Eyes:    PERRL, conjunctiva/corneas clear, EOM's intact, fundi    benign, both eyes        Throat:   Lips, mucosa, and tongue normal; teeth and gums normal  Neck:   Supple, symmetrical, trachea midline, no adenopathy;    thyroid:  no enlargement/tenderness/nodules; no carotid   bruit or JVD  Back:     Symmetric, no curvature, ROM normal, no CVA tenderness  Lungs:     Clear to auscultation bilaterally, respirations unlabored  Chest Wall:    No tenderness or deformity   Heart:    Regular rate and rhythm, S1 and S2 normal, no murmur, rub   or gallop     Abdomen:     Soft, non-tender, bowel sounds active all four quadrants,     no masses, no organomegaly        Extremities:   Extremities normal, atraumatic, no cyanosis or edema  Pulses:   2+ and symmetric all extremities  Skin:   Skin color, texture, turgor normal, no rashes or lesions  Lymph nodes:   Cervical, supraclavicular, and axillary nodes normal  Neurologic:   CNII-XII intact, normal strength, sensation and reflexes    throughout    LABORATORY DATA:  No results found for this or any previous visit (from the past 48 hours).    RADIOGRAPHY: No results found.     PATHOLOGY: None  ASSESSMENT/PLAN:  Ms. Menon is a pleasant 64 yo caucasian female with long history of IDA.  We will get her set up for 2 doses of Feraheme.  Follow-up in 3 months once she is done with Tax season.   All questions were answered. The patient knows to call the clinic with any problems, questions or concerns. We can certainly see the patient much sooner if necessary.   Lauraine Pepper, NP           [1]  Allergies Allergen Reactions   Morphine Nausea And Vomiting   Oxycodone Nausea And Vomiting   Belviq [Lorcaserin  Hcl] Cough   Codeine Nausea And Vomiting   Contrave [Naltrexone-Bupropion Hcl Er]     Irritable.    Meloxicam      Felt crazy   Metformin  And Related     Diarrhea/GI upset   Ozempic  (0.25 Or 0.5 Mg-Dose) [Semaglutide (0.25 Or 0.5mg -Dos)]     nausea   Prednisone      Insomnia,mood changes Oral ONLY>  [2]  Current Outpatient Medications on File Prior to Visit  Medication Sig Dispense Refill   aspirin 325 MG tablet Take by mouth.     clonazePAM  (KLONOPIN ) 0.5 MG tablet TAKE ONE TABLET (0.5 MG total) BY MOUTH TWICE DAILY AS NEEDED FOR ANXIETY 180 tablet 0   diclofenac  sodium (VOLTAREN ) 1 % GEL Apply 2 g topically as needed. 100 g 3   Dulaglutide  (TRULICITY ) 1.5 MG/0.5ML SOAJ Inject 1.5 mg into the skin once a week. 6 mL 0   Empagliflozin-metFORMIN  HCl ER (SYNJARDY  XR) 03-999 MG TB24 Take 1 tablet by mouth daily. 90 tablet 0   hydrocortisone  2.5 %  ointment Apply to affected area BID. 30 g 0   Misc Natural Products (TART CHERRY ADVANCED PO) Take by mouth daily.     olmesartan -hydrochlorothiazide  (BENICAR  HCT) 20-12.5 MG tablet Take 1 tablet by mouth daily. 90 tablet 1   Omega 3-6-9 Fatty Acids (OMEGA 3-6-9 COMPLEX PO) Take by mouth daily.     ondansetron  (ZOFRAN ) 4 MG tablet Take 1 tablet (4 mg total) by  mouth every 8 (eight) hours as needed for nausea or vomiting. 20 tablet 2   ondansetron  (ZOFRAN -ODT) 8 MG disintegrating tablet Take 1 tablet (8 mg total) by mouth every 8 (eight) hours as needed for nausea. 20 tablet 3   pantoprazole  (PROTONIX ) 40 MG tablet Take 1 tablet (40 mg total) by mouth 2 (two) times daily. 180 tablet 3   REPATHA  SURECLICK 140 MG/ML SOAJ INJECT 140MG  INTO THE SKIN EVERY 14 DAYS 6 mL 4   montelukast  (SINGULAIR ) 10 MG tablet Take 1 tablet (10 mg total) by mouth at bedtime. 14 tablet 0   No current facility-administered medications on file prior to visit.

## 2024-06-27 ENCOUNTER — Other Ambulatory Visit: Payer: Self-pay | Admitting: Physician Assistant

## 2024-06-28 LAB — HM MAMMOGRAPHY

## 2024-06-30 ENCOUNTER — Inpatient Hospital Stay

## 2024-06-30 ENCOUNTER — Encounter: Payer: Self-pay | Admitting: Physician Assistant

## 2024-06-30 VITALS — BP 112/77 | HR 61 | Temp 97.7°F | Resp 17

## 2024-06-30 DIAGNOSIS — D509 Iron deficiency anemia, unspecified: Secondary | ICD-10-CM | POA: Diagnosis not present

## 2024-06-30 MED ORDER — SODIUM CHLORIDE 0.9 % IV SOLN: INTRAVENOUS | Status: DC

## 2024-06-30 MED ORDER — SODIUM CHLORIDE 0.9 % IV SOLN
510.0000 mg | Freq: Once | INTRAVENOUS | Status: AC
  Administered 2024-06-30: 510 mg via INTRAVENOUS
  Filled 2024-06-30: qty 17

## 2024-06-30 NOTE — Patient Instructions (Signed)

## 2024-09-23 ENCOUNTER — Ambulatory Visit: Admitting: Physician Assistant

## 2024-09-26 ENCOUNTER — Inpatient Hospital Stay: Admitting: Family

## 2024-09-26 ENCOUNTER — Inpatient Hospital Stay
